# Patient Record
Sex: Female | Born: 1985 | Race: White | Hispanic: No | Marital: Married | Smoking: Never smoker
Health system: Southern US, Community
[De-identification: ages and names within clinical notes are randomized; demographics above are authoritative.]

## PROBLEM LIST (undated history)

## (undated) ENCOUNTER — Inpatient Hospital Stay (HOSPITAL_COMMUNITY): Payer: Self-pay

## (undated) DIAGNOSIS — Z9889 Other specified postprocedural states: Secondary | ICD-10-CM

## (undated) DIAGNOSIS — Z789 Other specified health status: Secondary | ICD-10-CM

## (undated) DIAGNOSIS — Z87440 Personal history of urinary (tract) infections: Secondary | ICD-10-CM

## (undated) DIAGNOSIS — Z87448 Personal history of other diseases of urinary system: Secondary | ICD-10-CM

## (undated) DIAGNOSIS — R112 Nausea with vomiting, unspecified: Secondary | ICD-10-CM

## (undated) HISTORY — DX: Other specified health status: Z78.9

## (undated) HISTORY — PX: BREAST ENHANCEMENT SURGERY: SHX7

## (undated) HISTORY — PX: WISDOM TOOTH EXTRACTION: SHX21

---
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
## 2006-05-28 ENCOUNTER — Other Ambulatory Visit: Admission: RE | Admit: 2006-05-28 | Discharge: 2006-05-28 | Payer: Self-pay | Admitting: Obstetrics and Gynecology

## 2007-07-01 ENCOUNTER — Other Ambulatory Visit: Admission: RE | Admit: 2007-07-01 | Discharge: 2007-07-01 | Payer: Self-pay | Admitting: Obstetrics and Gynecology

## 2008-08-17 ENCOUNTER — Other Ambulatory Visit: Admission: RE | Admit: 2008-08-17 | Discharge: 2008-08-17 | Payer: Self-pay | Admitting: Obstetrics and Gynecology

## 2008-12-15 ENCOUNTER — Emergency Department (HOSPITAL_COMMUNITY): Admission: EM | Admit: 2008-12-15 | Discharge: 2008-12-15 | Payer: Self-pay | Admitting: Emergency Medicine

## 2009-09-10 ENCOUNTER — Emergency Department (HOSPITAL_BASED_OUTPATIENT_CLINIC_OR_DEPARTMENT_OTHER): Admission: EM | Admit: 2009-09-10 | Discharge: 2009-09-10 | Payer: Self-pay | Admitting: Emergency Medicine

## 2009-10-23 ENCOUNTER — Other Ambulatory Visit: Admission: RE | Admit: 2009-10-23 | Discharge: 2009-10-23 | Payer: Self-pay | Admitting: Obstetrics and Gynecology

## 2010-05-11 LAB — COMPREHENSIVE METABOLIC PANEL
ALT: 17 U/L (ref 0–35)
AST: 28 U/L (ref 0–37)
Albumin: 4.2 g/dL (ref 3.5–5.2)
Alkaline Phosphatase: 51 U/L (ref 39–117)
BUN: 19 mg/dL (ref 6–23)
CO2: 20 mEq/L (ref 19–32)
Calcium: 9 mg/dL (ref 8.4–10.5)
Chloride: 108 mEq/L (ref 96–112)
Creatinine, Ser: 0.8 mg/dL (ref 0.4–1.2)
GFR calc Af Amer: >60 mL/min (ref >60)
GFR calc non Af Amer: >60 mL/min (ref >60)
Glucose, Bld: 100 mg/dL — ABNORMAL HIGH (ref 70–99)
Potassium: 3.9 mEq/L (ref 3.5–5.1)
Sodium: 143 mEq/L (ref 135–145)
Total Bilirubin: 1.1 mg/dL (ref 0.3–1.2)
Total Protein: 8.1 g/dL (ref 6.0–8.3)

## 2010-05-11 LAB — URINE MICROSCOPIC-ADD ON

## 2010-05-11 LAB — DIFFERENTIAL
Basophils Absolute: 0 10*3/uL (ref 0.0–0.1)
Basophils Relative: 1 % (ref 0–1)
Eosinophils Absolute: 0.1 10*3/uL (ref 0.0–0.7)
Eosinophils Relative: 1 % (ref 0–5)
Lymphocytes Relative: 5 % — ABNORMAL LOW (ref 12–46)
Lymphs Abs: 0.4 10*3/uL — ABNORMAL LOW (ref 0.7–4.0)
Monocytes Absolute: 0.6 10*3/uL (ref 0.1–1.0)
Monocytes Relative: 9 % (ref 3–12)
Neutro Abs: 5.9 10*3/uL (ref 1.7–7.7)
Neutrophils Relative %: 84 % — ABNORMAL HIGH (ref 43–77)

## 2010-05-11 LAB — PREGNANCY, URINE: Preg Test, Ur: NEGATIVE

## 2010-05-11 LAB — URINALYSIS, ROUTINE W REFLEX MICROSCOPIC
Glucose, UA: NEGATIVE mg/dL
Ketones, ur: 15 mg/dL — AB
Nitrite: NEGATIVE
Protein, ur: NEGATIVE mg/dL
Specific Gravity, Urine: 1.029 (ref 1.005–1.030)
Urobilinogen, UA: 0.2 mg/dL (ref 0.0–1.0)
pH: 6 (ref 5.0–8.0)

## 2010-05-11 LAB — CBC
HCT: 44.7 % (ref 36.0–46.0)
Hemoglobin: 15 g/dL (ref 12.0–15.0)
MCH: 30 pg (ref 26.0–34.0)
MCHC: 33.6 g/dL (ref 30.0–36.0)
MCV: 89.2 fL (ref 78.0–100.0)
Platelets: 165 10*3/uL (ref 150–400)
RBC: 5.01 MIL/uL (ref 3.87–5.11)
RDW: 12.4 % (ref 11.5–15.5)
WBC: 7 10*3/uL (ref 4.0–10.5)

## 2010-05-11 LAB — LIPASE, BLOOD: Lipase: 140 U/L (ref 23–300)

## 2010-05-30 LAB — URINE MICROSCOPIC-ADD ON

## 2010-05-30 LAB — DIFFERENTIAL
Basophils Absolute: 0 10*3/uL (ref 0.0–0.1)
Basophils Relative: 0 % (ref 0–1)
Eosinophils Absolute: 0.2 10*3/uL (ref 0.0–0.7)
Eosinophils Relative: 4 % (ref 0–5)
Lymphocytes Relative: 10 % — ABNORMAL LOW (ref 12–46)
Lymphs Abs: 0.6 10*3/uL — ABNORMAL LOW (ref 0.7–4.0)
Monocytes Absolute: 0.7 10*3/uL (ref 0.1–1.0)
Monocytes Relative: 13 % — ABNORMAL HIGH (ref 3–12)
Neutro Abs: 4.3 10*3/uL (ref 1.7–7.7)
Neutrophils Relative %: 73 % (ref 43–77)

## 2010-05-30 LAB — COMPREHENSIVE METABOLIC PANEL
ALT: 26 U/L (ref 0–35)
AST: 24 U/L (ref 0–37)
Albumin: 3.7 g/dL (ref 3.5–5.2)
Alkaline Phosphatase: 63 U/L (ref 39–117)
BUN: 8 mg/dL (ref 6–23)
CO2: 23 mEq/L (ref 19–32)
Calcium: 9 mg/dL (ref 8.4–10.5)
Chloride: 103 mEq/L (ref 96–112)
Creatinine, Ser: 0.83 mg/dL (ref 0.4–1.2)
GFR calc Af Amer: >60 mL/min (ref >60)
GFR calc non Af Amer: >60 mL/min (ref >60)
Glucose, Bld: 88 mg/dL (ref 70–99)
Potassium: 3.1 mEq/L — ABNORMAL LOW (ref 3.5–5.1)
Sodium: 136 mEq/L (ref 135–145)
Total Bilirubin: 1.1 mg/dL (ref 0.3–1.2)
Total Protein: 7 g/dL (ref 6.0–8.3)

## 2010-05-30 LAB — GIARDIA/CRYPTOSPORIDIUM SCREEN(EIA)
Cryptosporidium Screen (EIA): NEGATIVE
Giardia Screen - EIA: NEGATIVE

## 2010-05-30 LAB — URINALYSIS, ROUTINE W REFLEX MICROSCOPIC
Bilirubin Urine: NEGATIVE
Glucose, UA: NEGATIVE mg/dL
Hgb urine dipstick: NEGATIVE
Ketones, ur: 15 mg/dL — AB
Nitrite: NEGATIVE
Protein, ur: NEGATIVE mg/dL
Specific Gravity, Urine: 1.02 (ref 1.005–1.030)
Urobilinogen, UA: 0.2 mg/dL (ref 0.0–1.0)
pH: 6.5 (ref 5.0–8.0)

## 2010-05-30 LAB — CBC
HCT: 41.4 % (ref 36.0–46.0)
Hemoglobin: 14.6 g/dL (ref 12.0–15.0)
MCHC: 35.3 g/dL (ref 30.0–36.0)
MCV: 89.6 fL (ref 78.0–100.0)
Platelets: 134 10*3/uL — ABNORMAL LOW (ref 150–400)
RBC: 4.62 MIL/uL (ref 3.87–5.11)
RDW: 12.7 % (ref 11.5–15.5)
WBC: 5.8 10*3/uL (ref 4.0–10.5)

## 2010-05-30 LAB — POCT PREGNANCY, URINE: Preg Test, Ur: NEGATIVE

## 2010-05-30 LAB — LIPASE, BLOOD: Lipase: 19 U/L (ref 11–59)

## 2011-03-13 ENCOUNTER — Other Ambulatory Visit (HOSPITAL_COMMUNITY)
Admission: RE | Admit: 2011-03-13 | Discharge: 2011-03-13 | Disposition: A | Discharge status: Home / Self Care | Payer: BC Managed Care – PPO | Source: Ambulatory Visit | Attending: Obstetrics and Gynecology | Admitting: Obstetrics and Gynecology

## 2011-03-13 ENCOUNTER — Other Ambulatory Visit: Payer: Self-pay | Admitting: Nurse Practitioner

## 2011-03-13 DIAGNOSIS — Z01419 Encounter for gynecological examination (general) (routine) without abnormal findings: Secondary | ICD-10-CM | POA: Insufficient documentation

## 2011-03-27 ENCOUNTER — Emergency Department (HOSPITAL_BASED_OUTPATIENT_CLINIC_OR_DEPARTMENT_OTHER)
Admission: EM | Admit: 2011-03-27 | Discharge: 2011-03-27 | Disposition: A | Discharge status: Home / Self Care | Payer: BC Managed Care – PPO | Attending: Emergency Medicine | Admitting: Emergency Medicine

## 2011-03-27 ENCOUNTER — Encounter (HOSPITAL_BASED_OUTPATIENT_CLINIC_OR_DEPARTMENT_OTHER): Payer: Self-pay | Admitting: *Deleted

## 2011-03-27 ENCOUNTER — Emergency Department (INDEPENDENT_AMBULATORY_CARE_PROVIDER_SITE_OTHER): Payer: BC Managed Care – PPO

## 2011-03-27 DIAGNOSIS — R1031 Right lower quadrant pain: Secondary | ICD-10-CM | POA: Insufficient documentation

## 2011-03-27 DIAGNOSIS — K3189 Other diseases of stomach and duodenum: Secondary | ICD-10-CM

## 2011-03-27 DIAGNOSIS — R112 Nausea with vomiting, unspecified: Secondary | ICD-10-CM

## 2011-03-27 DIAGNOSIS — R109 Unspecified abdominal pain: Secondary | ICD-10-CM

## 2011-03-27 LAB — URINALYSIS, ROUTINE W REFLEX MICROSCOPIC
Bilirubin Urine: NEGATIVE
Glucose, UA: NEGATIVE mg/dL
Hgb urine dipstick: NEGATIVE
Ketones, ur: NEGATIVE mg/dL
Nitrite: NEGATIVE
Protein, ur: NEGATIVE mg/dL
Specific Gravity, Urine: 1.012 (ref 1.005–1.030)
Urobilinogen, UA: 0.2 mg/dL (ref 0.0–1.0)
pH: 7.5 (ref 5.0–8.0)

## 2011-03-27 LAB — DIFFERENTIAL
Basophils Relative: 0 % (ref 0–1)
Eosinophils Absolute: 0.1 10*3/uL (ref 0.0–0.7)
Eosinophils Relative: 2 % (ref 0–5)
Lymphs Abs: 1.3 10*3/uL (ref 0.7–4.0)
Monocytes Relative: 4 % (ref 3–12)

## 2011-03-27 LAB — CBC
MCH: 29.5 pg (ref 26.0–34.0)
MCHC: 35 g/dL (ref 30.0–36.0)
MCV: 84.2 fL (ref 78.0–100.0)
Platelets: 176 10*3/uL (ref 150–400)
RBC: 4.75 MIL/uL (ref 3.87–5.11)

## 2011-03-27 LAB — COMPREHENSIVE METABOLIC PANEL
Albumin: 3.7 g/dL (ref 3.5–5.2)
BUN: 16 mg/dL (ref 6–23)
Calcium: 9.5 mg/dL (ref 8.4–10.5)
GFR calc Af Amer: >90 mL/min (ref >90)
Glucose, Bld: 82 mg/dL (ref 70–99)
Sodium: 138 mEq/L (ref 135–145)
Total Protein: 7.1 g/dL (ref 6.0–8.3)

## 2011-03-27 LAB — URINE MICROSCOPIC-ADD ON

## 2011-03-27 LAB — LIPASE, BLOOD: Lipase: 26 U/L (ref 11–59)

## 2011-03-27 LAB — PREGNANCY, URINE: Preg Test, Ur: NEGATIVE

## 2011-03-27 MED ORDER — IOHEXOL 300 MG/ML  SOLN
20.0000 mL | Freq: Once | INTRAMUSCULAR | Status: AC | PRN
  Administered 2011-03-27: 20 mL via ORAL

## 2011-03-27 MED ORDER — OXYCODONE-ACETAMINOPHEN 5-325 MG PO TABS: 1.0000 | ORAL_TABLET | ORAL | Status: AC | PRN

## 2011-03-27 MED ORDER — HYDROMORPHONE HCL PF 1 MG/ML IJ SOLN
1.0000 mg | Freq: Once | INTRAMUSCULAR | Status: AC
  Administered 2011-03-27: 1 mg via INTRAVENOUS
  Filled 2011-03-27: qty 1

## 2011-03-27 MED ORDER — SODIUM CHLORIDE 0.9 % IV SOLN
Freq: Once | INTRAVENOUS | Status: AC
  Administered 2011-03-27: 20:00:00 via INTRAVENOUS

## 2011-03-27 MED ORDER — PROMETHAZINE HCL 25 MG PO TABS: 25.0000 mg | ORAL_TABLET | Freq: Four times a day (QID) | ORAL | Status: DC | PRN

## 2011-03-27 MED ORDER — IOHEXOL 300 MG/ML  SOLN
100.0000 mL | Freq: Once | INTRAMUSCULAR | Status: AC | PRN
  Administered 2011-03-27: 100 mL via INTRAVENOUS

## 2011-03-27 MED ORDER — ONDANSETRON HCL 4 MG/2ML IJ SOLN
4.0000 mg | Freq: Once | INTRAMUSCULAR | Status: AC
  Administered 2011-03-27: 4 mg via INTRAVENOUS
  Filled 2011-03-27: qty 2

## 2011-03-27 NOTE — ED Provider Notes (Signed)
History     CSN: [id number]  Arrival date & time 03/27/11  4098   First MD Initiated Contact with Patient 03/27/11 1912      Chief Complaint  Patient presents with  . Abdominal Pain    (Consider location/radiation/quality/duration/timing/severity/associated sxs/prior treatment) Patient is a 26 y.o. female presenting with abdominal pain. The history is provided by the patient.  Abdominal Pain The primary symptoms of the illness include abdominal pain.   3 days ago, she had pain across her mid abdomen which was severe. She rated it at 10 out of 10, but it resolved spontaneously. This morning, she had onset at about 10:30 AM of the same pain. It is sharp and dull and crampy. Nothing makes it better and nothing makes it worse. There is associated nausea but no vomiting. She denies constipation or diarrhea. She denies urinary difficulty. She denies fever, chills, sweats. She tried taking TUMS which did not give her any relief. She saw her primary care doctor who is worried that it might be her appendix and might be her gallbladder and sent her here to get a CT scan. She is currently on her menses.  History reviewed. No pertinent past medical history.  History reviewed. No pertinent past surgical history.  No family history on file.  History  Substance Use Topics  . Smoking status: Never Smoker   . Smokeless tobacco: Not on file  . Alcohol Use: No    OB History    Grav Para Term Preterm Abortions TAB SAB Ect Mult Living                  Review of Systems  Gastrointestinal: Positive for abdominal pain.  All other systems reviewed and are negative.    Allergies  Latex  Home Medications   Current Outpatient Rx  Name Route Sig Dispense Refill  . ACETAMINOPHEN-CAFF-PYRILAMINE 500-60-15 MG PO TABS Oral Take 2 tablets by mouth once as needed. For pain    . CALCIUM CARBONATE ANTACID 500 MG PO CHEW Oral Chew 2 tablets by mouth once as needed. For stomach pain    . CITALOPRAM  HYDROBROMIDE 20 MG PO TABS Oral Take 20 mg by mouth daily.    . ADULT MULTIVITAMIN W/MINERALS CH Oral Take 1 tablet by mouth daily.    Azzie Roup ACE-ETH ESTRAD-FE 1.5-30 MG-MCG PO TABS Oral Take 1 tablet by mouth daily.    Marland Kitchen VITAMIN C 500 MG PO TABS Oral Take 1,000 mg by mouth daily.      BP 100/69  Pulse 66  Temp(Src) 98 F (36.7 C) (Oral)  Resp 20  SpO2 100%  LMP 03/23/2011  Physical Exam  Nursing note and vitals reviewed.  26 year old female who is resting comfortably and in no acute distress. Vital signs are normal. Head is normocephalic and atraumatic. There is anisocoria present - right pupil is 6 mm and left pupil is 5 mm. Both react to light and accommodation. EOMs are full. Oropharynx is clear. Neck is supple without adenopathy. Back is nontender and there is no CVA tenderness. Lungs are clear without rales, wheezes, rhonchi. Heart has regular rate and rhythm without murmur. Is no chest wall tenderness. Abdomen is soft and flat. There is moderate tenderness across the mid abdomen. There is no tenderness in the subcostal area and there is a negative Murphy sign. There is no suprapubic or right lower quadrant tenderness. There is no rebound or guarding. Peristalsis is diminished but present. There is no hepatosplenomegaly. Extremities have no  no cyanosis or edema, full range of motion present. Skin is warm and moist without rash. Neurologic: Mental status is normal, cranial nerves are intact, there no focal motor or sensory deficits. Psychiatric: No abnormalities of mood or affect.  ED Course  Procedures (including critical care time)   Results for orders placed during the hospital encounter of 03/27/11  PREGNANCY, URINE      Component Value Range   Preg Test, Ur NEGATIVE  NEGATIVE   URINALYSIS, ROUTINE W REFLEX MICROSCOPIC      Component Value Range   Color, Urine YELLOW  YELLOW    APPearance CLEAR  CLEAR    Specific Gravity, Urine 1.012  1.005 - 1.030    pH 7.5  5.0 - 8.0     Glucose, UA NEGATIVE  NEGATIVE (mg/dL)   Hgb urine dipstick NEGATIVE  NEGATIVE    Bilirubin Urine NEGATIVE  NEGATIVE    Ketones, ur NEGATIVE  NEGATIVE (mg/dL)   Protein, ur NEGATIVE  NEGATIVE (mg/dL)   Urobilinogen, UA 0.2  0.0 - 1.0 (mg/dL)   Nitrite NEGATIVE  NEGATIVE    Leukocytes, UA SMALL (*) NEGATIVE   URINE MICROSCOPIC-ADD ON      Component Value Range   Squamous Epithelial / LPF RARE  RARE    WBC, UA 3-6  <3 (WBC/hpf)   Bacteria, UA RARE  RARE   CBC      Component Value Range   WBC 8.1  4.0 - 10.5 (K/uL)   RBC 4.75  3.87 - 5.11 (MIL/uL)   Hemoglobin 14.0  12.0 - 15.0 (g/dL)   HCT 16.1  09.6 - 04.5 (%)   MCV 84.2  78.0 - 100.0 (fL)   MCH 29.5  26.0 - 34.0 (pg)   MCHC 35.0  30.0 - 36.0 (g/dL)   RDW 40.9  81.1 - 91.4 (%)   Platelets 176  150 - 400 (K/uL)  DIFFERENTIAL      Component Value Range   Neutrophils Relative 79 (*) 43 - 77 (%)   Neutro Abs 6.4  1.7 - 7.7 (K/uL)   Lymphocytes Relative 16  12 - 46 (%)   Lymphs Abs 1.3  0.7 - 4.0 (K/uL)   Monocytes Relative 4  3 - 12 (%)   Monocytes Absolute 0.3  0.1 - 1.0 (K/uL)   Eosinophils Relative 2  0 - 5 (%)   Eosinophils Absolute 0.1  0.0 - 0.7 (K/uL)   Basophils Relative 0  0 - 1 (%)   Basophils Absolute 0.0  0.0 - 0.1 (K/uL)  COMPREHENSIVE METABOLIC PANEL      Component Value Range   Sodium 138  135 - 145 (mEq/L)   Potassium 3.8  3.5 - 5.1 (mEq/L)   Chloride 102  96 - 112 (mEq/L)   CO2 26  19 - 32 (mEq/L)   Glucose, Bld 82  70 - 99 (mg/dL)   BUN 16  6 - 23 (mg/dL)   Creatinine, Ser 7.82  0.50 - 1.10 (mg/dL)   Calcium 9.5  8.4 - 95.6 (mg/dL)   Total Protein 7.1  6.0 - 8.3 (g/dL)   Albumin 3.7  3.5 - 5.2 (g/dL)   AST 23  0 - 37 (U/L)   ALT 18  0 - 35 (U/L)   Alkaline Phosphatase 61  39 - 117 (U/L)   Total Bilirubin 0.7  0.3 - 1.2 (mg/dL)   GFR calc non Af Amer >90  >90 (mL/min)   GFR calc Af Amer >90  >90 (mL/min)  LIPASE, BLOOD      Component Value Range   Lipase 26  11 - 59 (U/L)   Ct Abdomen Pelvis W  Contrast  03/27/2011  *RADIOLOGY REPORT*  Clinical Data: Endogastric pain, nausea, vomiting  CT ABDOMEN AND PELVIS WITH CONTRAST  Technique:  Multidetector CT imaging of the abdomen and pelvis was performed following the standard protocol during bolus administration of intravenous contrast. Sagittal and coronal MPR images reconstructed from axial data set.  Contrast: Dilute oral contrast. 100 ml Omnipaque 300 IV.  Comparison: None.  Findings: Lung bases clear. Inhomogeneous enhancement of hepatic parenchyma suspect technical in origin. No definite focal mass lesions of liver, spleen, pancreas, kidneys, or adrenal glands. Nonspecific free pelvic fluid. Normal appendix. Stomach and bowel loops grossly unremarkable. Normal-appearing bladder, uterus, and adnexae. No mass, adenopathy, or hernia. No acute osseous a.  IMPRESSION: Small amount nonspecific free pelvic fluid. Inhomogeneous hepatic attenuation, likely related to incomplete enhancement secondary to timing of imaging versus contrast administration and possibly hydration status. No other definite intra-abdominal or intrapelvic abnormalities seen.  Original Report Authenticated By: Lollie Marrow, M.D.    She was given IV fluids and IV Dilaudid with Zofran with significant relief of symptoms. Have discussed the findings on CT and laboratory workup with her. Further workup would need to be to a gastroenterologist if symptoms persist. She is sent home with a prescription for Percocet and for Phenergan.  1. Abdominal pain       MDM  Abdominal pain which seems very unlikely to be biliary in nature with no subcostal tenderness and negative Murphy sign. Exam is not especially worrisome for appendicitis. I discussed with patient possibility of having lab tests done and deciding whether to get a CT scan based on lab results. However, and she states she was sent to the ED to get a CT scan done. I've advised her of the radiation risks of the CT, but she still  wishes to get the CT scan done.        Dione Booze, MD 03/27/11 2049

## 2011-03-27 NOTE — ED Notes (Signed)
Pt c/o periumbilical pain with periumbilical tenderness. Pt was seen by PMD earlier and sent here for appendicitis and gall bladder r/o.

## 2011-03-27 NOTE — ED Notes (Signed)
Mid abdominal pain x 3 days. Pale. Was seen by her MD earlier today.

## 2012-03-16 ENCOUNTER — Other Ambulatory Visit (HOSPITAL_COMMUNITY)
Admission: RE | Admit: 2012-03-16 | Discharge: 2012-03-16 | Disposition: A | Discharge status: Home / Self Care | Payer: BC Managed Care – PPO | Source: Ambulatory Visit | Attending: Obstetrics and Gynecology | Admitting: Obstetrics and Gynecology

## 2012-03-16 ENCOUNTER — Other Ambulatory Visit: Payer: Self-pay | Admitting: Nurse Practitioner

## 2012-03-16 DIAGNOSIS — N760 Acute vaginitis: Secondary | ICD-10-CM | POA: Insufficient documentation

## 2012-03-16 DIAGNOSIS — Z01419 Encounter for gynecological examination (general) (routine) without abnormal findings: Secondary | ICD-10-CM | POA: Insufficient documentation

## 2012-03-16 DIAGNOSIS — Z113 Encounter for screening for infections with a predominantly sexual mode of transmission: Secondary | ICD-10-CM | POA: Insufficient documentation

## 2014-03-16 ENCOUNTER — Other Ambulatory Visit: Payer: Self-pay | Admitting: Nurse Practitioner

## 2014-03-16 ENCOUNTER — Other Ambulatory Visit (HOSPITAL_COMMUNITY)
Admission: RE | Admit: 2014-03-16 | Discharge: 2014-03-16 | Disposition: A | Discharge status: Home / Self Care | Payer: BC Managed Care – PPO | Source: Ambulatory Visit | Attending: Obstetrics and Gynecology | Admitting: Obstetrics and Gynecology

## 2014-03-16 DIAGNOSIS — Z01419 Encounter for gynecological examination (general) (routine) without abnormal findings: Secondary | ICD-10-CM | POA: Insufficient documentation

## 2014-03-21 LAB — CYTOLOGY - PAP

## 2015-08-03 ENCOUNTER — Other Ambulatory Visit: Payer: Self-pay | Admitting: Obstetrics and Gynecology

## 2015-08-03 DIAGNOSIS — R1013 Epigastric pain: Secondary | ICD-10-CM

## 2015-08-03 DIAGNOSIS — R1084 Generalized abdominal pain: Secondary | ICD-10-CM

## 2015-08-07 ENCOUNTER — Other Ambulatory Visit: Payer: Self-pay | Admitting: Obstetrics and Gynecology

## 2015-08-07 ENCOUNTER — Ambulatory Visit
Admission: RE | Admit: 2015-08-07 | Discharge: 2015-08-07 | Disposition: A | Discharge status: Home / Self Care | Payer: BC Managed Care – PPO | Source: Ambulatory Visit | Attending: Obstetrics and Gynecology | Admitting: Obstetrics and Gynecology

## 2015-08-07 DIAGNOSIS — R1084 Generalized abdominal pain: Secondary | ICD-10-CM

## 2015-08-07 DIAGNOSIS — R1013 Epigastric pain: Secondary | ICD-10-CM

## 2015-08-07 DIAGNOSIS — R52 Pain, unspecified: Secondary | ICD-10-CM

## 2015-08-08 ENCOUNTER — Ambulatory Visit
Admission: RE | Admit: 2015-08-08 | Discharge: 2015-08-08 | Disposition: A | Discharge status: Home / Self Care | Payer: BC Managed Care – PPO | Source: Ambulatory Visit | Attending: Obstetrics and Gynecology | Admitting: Obstetrics and Gynecology

## 2015-08-08 DIAGNOSIS — R1013 Epigastric pain: Secondary | ICD-10-CM

## 2015-08-10 ENCOUNTER — Other Ambulatory Visit: Payer: BC Managed Care – PPO

## 2016-04-03 LAB — OB RESULTS CONSOLE HIV ANTIBODY (ROUTINE TESTING): HIV: NONREACTIVE

## 2016-04-03 LAB — OB RESULTS CONSOLE RUBELLA ANTIBODY, IGM: RUBELLA: IMMUNE

## 2016-04-03 LAB — OB RESULTS CONSOLE GC/CHLAMYDIA
CHLAMYDIA, DNA PROBE: NEGATIVE
GC PROBE AMP, GENITAL: NEGATIVE

## 2016-04-03 LAB — OB RESULTS CONSOLE RPR: RPR: NONREACTIVE

## 2016-04-03 LAB — OB RESULTS CONSOLE ANTIBODY SCREEN: ANTIBODY SCREEN: NEGATIVE

## 2016-04-03 LAB — OB RESULTS CONSOLE ABO/RH: RH TYPE: POSITIVE

## 2016-04-03 LAB — OB RESULTS CONSOLE HEPATITIS B SURFACE ANTIGEN: Hepatitis B Surface Ag: NEGATIVE

## 2016-04-29 ENCOUNTER — Other Ambulatory Visit: Payer: Self-pay | Admitting: Obstetrics and Gynecology

## 2016-04-29 ENCOUNTER — Other Ambulatory Visit (HOSPITAL_COMMUNITY)
Admission: RE | Admit: 2016-04-29 | Discharge: 2016-04-29 | Disposition: A | Discharge status: Home / Self Care | Payer: BC Managed Care – PPO | Source: Ambulatory Visit | Attending: Obstetrics and Gynecology | Admitting: Obstetrics and Gynecology

## 2016-04-29 DIAGNOSIS — Z01419 Encounter for gynecological examination (general) (routine) without abnormal findings: Secondary | ICD-10-CM | POA: Diagnosis present

## 2016-04-29 DIAGNOSIS — Z113 Encounter for screening for infections with a predominantly sexual mode of transmission: Secondary | ICD-10-CM | POA: Diagnosis present

## 2016-05-01 LAB — CYTOLOGY - PAP
Chlamydia: NEGATIVE
Diagnosis: NEGATIVE
Neisseria Gonorrhea: NEGATIVE

## 2016-07-15 ENCOUNTER — Inpatient Hospital Stay (HOSPITAL_COMMUNITY)
Admission: AD | Admit: 2016-07-15 | Discharge: 2016-07-16 | Disposition: A | Discharge status: Home / Self Care | Payer: BC Managed Care – PPO | Source: Ambulatory Visit | Attending: Obstetrics and Gynecology | Admitting: Obstetrics and Gynecology

## 2016-07-15 DIAGNOSIS — Z91040 Latex allergy status: Secondary | ICD-10-CM | POA: Insufficient documentation

## 2016-07-15 DIAGNOSIS — O26892 Other specified pregnancy related conditions, second trimester: Secondary | ICD-10-CM | POA: Insufficient documentation

## 2016-07-15 DIAGNOSIS — O99612 Diseases of the digestive system complicating pregnancy, second trimester: Secondary | ICD-10-CM | POA: Insufficient documentation

## 2016-07-15 DIAGNOSIS — R109 Unspecified abdominal pain: Secondary | ICD-10-CM | POA: Insufficient documentation

## 2016-07-15 DIAGNOSIS — K5900 Constipation, unspecified: Secondary | ICD-10-CM | POA: Insufficient documentation

## 2016-07-15 DIAGNOSIS — M549 Dorsalgia, unspecified: Secondary | ICD-10-CM | POA: Insufficient documentation

## 2016-07-15 DIAGNOSIS — K625 Hemorrhage of anus and rectum: Secondary | ICD-10-CM | POA: Insufficient documentation

## 2016-07-15 DIAGNOSIS — Z3A23 23 weeks gestation of pregnancy: Secondary | ICD-10-CM | POA: Insufficient documentation

## 2016-07-16 ENCOUNTER — Encounter (HOSPITAL_COMMUNITY): Payer: Self-pay

## 2016-07-16 DIAGNOSIS — K5900 Constipation, unspecified: Secondary | ICD-10-CM | POA: Diagnosis not present

## 2016-07-16 DIAGNOSIS — R109 Unspecified abdominal pain: Secondary | ICD-10-CM | POA: Diagnosis present

## 2016-07-16 DIAGNOSIS — O26892 Other specified pregnancy related conditions, second trimester: Secondary | ICD-10-CM | POA: Diagnosis not present

## 2016-07-16 DIAGNOSIS — M549 Dorsalgia, unspecified: Secondary | ICD-10-CM | POA: Diagnosis present

## 2016-07-16 DIAGNOSIS — K625 Hemorrhage of anus and rectum: Secondary | ICD-10-CM | POA: Diagnosis not present

## 2016-07-16 DIAGNOSIS — Z91040 Latex allergy status: Secondary | ICD-10-CM | POA: Diagnosis not present

## 2016-07-16 DIAGNOSIS — O99612 Diseases of the digestive system complicating pregnancy, second trimester: Secondary | ICD-10-CM | POA: Diagnosis not present

## 2016-07-16 DIAGNOSIS — Z3A23 23 weeks gestation of pregnancy: Secondary | ICD-10-CM | POA: Diagnosis not present

## 2016-07-16 LAB — WET PREP, GENITAL
CLUE CELLS WET PREP: NONE SEEN
Sperm: NONE SEEN
Trich, Wet Prep: NONE SEEN
YEAST WET PREP: NONE SEEN

## 2016-07-16 LAB — AMNISURE RUPTURE OF MEMBRANE (ROM) NOT AT ARMC: Amnisure ROM: NEGATIVE

## 2016-07-16 MED ORDER — ACETAMINOPHEN 500 MG PO TABS: 1000.0000 mg | ORAL_TABLET | Freq: Once | ORAL | Status: DC

## 2016-07-16 MED ORDER — MINERAL OIL RE ENEM: 1.0000 | ENEMA | Freq: Once | RECTAL | Status: DC

## 2016-07-16 NOTE — MAU Note (Signed)
Low abd and low back pain since 7 pm. Had a normal bowel movement yesterday. Felt pressure, was instructed to take stool softner at 8:30 pm didn't help. Saw brownish-red but not sure where came from. Feeling baby move today.  Watery when tried to use bathroom so not sure if water broke or not.

## 2016-07-16 NOTE — MAU Provider Note (Signed)
History    Amber Richard is a 30y.o. G1P0 at 23wks who presents for abdominal and back pain related to constipation.  Patient states that she had a bowel movement yesterday that was soft and easy to pass.  Patient states that today she has been unable to have a bowel movement despite the urge.  Patient reports rectal pressure, rectal bleeding, and passage of small amts of stool but no relief.  Patient states she has ate "lots of fiber" today.  Patient denies cramping and vaginal bleeding, but reports LOF and is unsure of location.   There are no active problems to display for this patient.   Chief Complaint  Patient presents with  . Abdominal Pain  . Back Pain   HPI  OB History    Gravida Para Term Preterm AB Living   1             SAB TAB Ectopic Multiple Live Births                  No past medical history on file.  No past surgical history on file.  No family history on file.  Social History  Substance Use Topics  . Smoking status: Never Smoker  . Smokeless tobacco: Not on file  . Alcohol use No    Allergies:  Allergies  Allergen Reactions  . Latex Rash    Prescriptions Prior to Admission  Medication Sig Dispense Refill Last Dose  . Acetaminophen-Caff-Pyrilamine (MIDOL COMPLETE) 500-60-15 MG TABS Take 2 tablets by mouth once as needed. For pain   03/27/2011 at Unknown  . calcium carbonate (TUMS - DOSED IN MG ELEMENTAL CALCIUM) 500 MG chewable tablet Chew 2 tablets by mouth once as needed. For stomach pain     . citalopram (CELEXA) 20 MG tablet Take 20 mg by mouth daily.   03/26/2011 at Unknown  . Multiple Vitamin (MULITIVITAMIN WITH MINERALS) TABS Take 1 tablet by mouth daily.   03/27/2011 at Unknown  . norethindrone-ethinyl estradiol-iron (MICROGESTIN FE,GILDESS FE,LOESTRIN FE) 1.5-30 MG-MCG tablet Take 1 tablet by mouth daily.   03/26/2011 at Unknown  . vitamin C (ASCORBIC ACID) 500 MG tablet Take 1,000 mg by mouth daily.   03/27/2011 at Unknown    Review of  Systems  Constitutional: Negative for chills and fever.  Gastrointestinal: Positive for abdominal pain, blood in stool (Blood with wiping) and constipation. Negative for nausea and vomiting.  Genitourinary: Negative for dysuria and urgency.  Musculoskeletal: Positive for back pain.    See HPI Above Physical Exam   Blood pressure (!) 124/59, pulse 93, temperature 98.1 F (36.7 C), temperature source Oral, resp. rate 20, last menstrual period 08/07/2015, SpO2 98 %.  Results for orders placed or performed during the hospital encounter of 07/15/16 (from the past 24 hour(s))  Wet prep, genital     Status: Abnormal   Collection Time: 07/16/16 12:35 AM  Result Value Ref Range   Yeast Wet Prep HPF POC NONE SEEN NONE SEEN   Trich, Wet Prep NONE SEEN NONE SEEN   Clue Cells Wet Prep HPF POC NONE SEEN NONE SEEN   WBC, Wet Prep HPF POC MANY (A) NONE SEEN   Sperm NONE SEEN   Amnisure rupture of membrane (rom)not at [facility]     Status: None   Collection Time: 07/16/16 12:35 AM  Result Value Ref Range   Amnisure ROM NEGATIVE     Physical Exam  Constitutional: She is oriented to person, place, and time. She appears well-developed  and well-nourished. She appears distressed.  HENT:  Head: Normocephalic and atraumatic.  Eyes: Conjunctivae are normal.  Neck: Normal range of motion.  Cardiovascular: Normal rate, regular rhythm and normal heart sounds.   Respiratory: Effort normal and breath sounds normal.  GI: Soft. Bowel sounds are normal.  Genitourinary: Rectal exam shows no external hemorrhoid. Uterus is enlarged. No bleeding in the vagina. Vaginal discharge found.  Genitourinary Comments: Sterile Speculum Exam: -Vaginal Vault: Copious amt of thin white discharge -wet prep collected -Cervix:No discharge from os, appears closed-GC/CT collected -Bimanual Exam: Closed   Musculoskeletal: Normal range of motion. She exhibits no edema.  Neurological: She is alert and oriented to person, place, and  time.  Skin: Skin is warm and dry.    FHR: 155 bpm, Mod Var, -Decels, +Accels UC: None graphed ED Course  Assessment: IUP at 23wks Cat I FT Abdominal Pain Back Pain Constipation  Plan: -Labs: Wet prep, GC/CT, Amniosure-Collected prior to digital exam -Soap suds enema ordered -Tylenol 1gram for pain -Reassess after enema given  Follow Up (0130) -Patient reports relief s/p enema and passage of large stool -Wet Prep and Amniosure Negative -Instructed to take colace bid until normal regime returns and then daily at bedtime -Keep appt as scheduled: May 30 with Dr. Delrae Sawyers. Cole -Encouraged to call if any questions or concerns arise prior to next scheduled office visit.  -Discharged to home in improved condition  Cherre RobinsJessica L Draya Felker CNM, MSN 07/16/2016 12:36 AM

## 2016-07-16 NOTE — Discharge Instructions (Signed)
Constipation, Adult °Constipation is when a person: °· Poops (has a bowel movement) fewer times in a week than normal. °· Has a hard time pooping. °· Has poop that is dry, hard, or bigger than normal. ° °Follow these instructions at home: °Eating and drinking ° °· Eat foods that have a lot of fiber, such as: °? Fresh fruits and vegetables. °? Whole grains. °? Beans. °· Eat less of foods that are high in fat, low in fiber, or overly processed, such as: °? French fries. °? Hamburgers. °? Cookies. °? Candy. °? Soda. °· Drink enough fluid to keep your pee (urine) clear or pale yellow. °General instructions °· Exercise regularly or as told by your doctor. °· Go to the restroom when you feel like you need to poop. Do not hold it in. °· Take over-the-counter and prescription medicines only as told by your doctor. These include any fiber supplements. °· Do pelvic floor retraining exercises, such as: °? Doing deep breathing while relaxing your lower belly (abdomen). °? Relaxing your pelvic floor while pooping. °· Watch your condition for any changes. °· Keep all follow-up visits as told by your doctor. This is important. °Contact a doctor if: °· You have pain that gets worse. °· You have a fever. °· You have not pooped for 4 days. °· You throw up (vomit). °· You are not hungry. °· You lose weight. °· You are bleeding from the anus. °· You have thin, pencil-like poop (stool). °Get help right away if: °· You have a fever, and your symptoms suddenly get worse. °· You leak poop or have blood in your poop. °· Your belly feels hard or bigger than normal (is bloated). °· You have very bad belly pain. °· You feel dizzy or you faint. °This information is not intended to replace advice given to you by your health care provider. Make sure you discuss any questions you have with your health care provider. °Document Released: 07/30/2007 Document Revised: 08/31/2015 Document Reviewed: 08/01/2015 °Elsevier Interactive Patient Education ©  2017 [employer] ° °

## 2016-07-17 LAB — GC/CHLAMYDIA PROBE AMP (~~LOC~~) NOT AT ARMC
CHLAMYDIA, DNA PROBE: NEGATIVE
NEISSERIA GONORRHEA: NEGATIVE

## 2016-08-19 ENCOUNTER — Encounter: Payer: Self-pay | Admitting: Family Medicine

## 2016-08-19 ENCOUNTER — Ambulatory Visit (INDEPENDENT_AMBULATORY_CARE_PROVIDER_SITE_OTHER): Payer: BC Managed Care – PPO | Admitting: Family Medicine

## 2016-08-19 VITALS — BP 118/72 | Wt 154.6 lb

## 2016-08-19 DIAGNOSIS — Z3A28 28 weeks gestation of pregnancy: Secondary | ICD-10-CM | POA: Diagnosis not present

## 2016-08-19 NOTE — Progress Notes (Signed)
   Subjective:    Patient ID: Amber ShinesKristina Richard, female    DOB: 03-30-1985, 30 y.o.   MRN: [id number]  HPI Patient arrives as a new patient for consult. Patient is currently pregnant-due in September and looking to find a good doctor for her baby girl.  [place] [employer], [employer] ,gyn and [place]  No difficulty with  u s   goo d u s an Engineer, drillingdnnormal    [employer] on the u s  f u us,   f u   Would like to try breat feeding , hoing fof  br pum coverage    Plans n being out for two mo ths  Review of Systems No headache, no major weight loss or weight gain, no chest pain no back pain abdominal pain no change in bowel habits complete ROS otherwise negative     Objective:   Physical Exam   Patient not examined      Assessment & Plan:  Impression patient arrives office primarily for prenatal visit/very long discussion held/at least 30 minutes spent discussing concerns and questions regarding perinatal and early infancy management/questions answered Greater than 50% of this 25 minute face to face visit was spent in counseling and discussion and coordination of care regarding the above diagnosis/diagnosies

## 2016-09-27 ENCOUNTER — Encounter (HOSPITAL_COMMUNITY): Payer: Self-pay | Admitting: *Deleted

## 2016-09-27 ENCOUNTER — Inpatient Hospital Stay (HOSPITAL_COMMUNITY)
Admission: AD | Admit: 2016-09-27 | Discharge: 2016-09-27 | Disposition: A | Discharge status: Home / Self Care | Payer: BC Managed Care – PPO | Source: Ambulatory Visit | Attending: Obstetrics & Gynecology | Admitting: Obstetrics & Gynecology

## 2016-09-27 DIAGNOSIS — O4693 Antepartum hemorrhage, unspecified, third trimester: Secondary | ICD-10-CM

## 2016-09-27 DIAGNOSIS — N859 Noninflammatory disorder of uterus, unspecified: Secondary | ICD-10-CM

## 2016-09-27 DIAGNOSIS — Z3A33 33 weeks gestation of pregnancy: Secondary | ICD-10-CM | POA: Diagnosis not present

## 2016-09-27 DIAGNOSIS — N858 Other specified noninflammatory disorders of uterus: Secondary | ICD-10-CM

## 2016-09-27 DIAGNOSIS — Z91040 Latex allergy status: Secondary | ICD-10-CM | POA: Diagnosis not present

## 2016-09-27 LAB — URINALYSIS, ROUTINE W REFLEX MICROSCOPIC
Bilirubin Urine: NEGATIVE
Glucose, UA: NEGATIVE mg/dL
KETONES UR: NEGATIVE mg/dL
Nitrite: NEGATIVE
PH: 7 (ref 5.0–8.0)
Protein, ur: NEGATIVE mg/dL
SPECIFIC GRAVITY, URINE: 1.002 — AB (ref 1.005–1.030)

## 2016-09-27 MED ORDER — LACTATED RINGERS IV SOLN
INTRAVENOUS | Status: DC
  Administered 2016-09-27: 14:00:00 via INTRAVENOUS

## 2016-09-27 NOTE — MAU Provider Note (Signed)
Chief Complaint:  Vaginal Bleeding and Back Pain   First Provider Initiated Contact with Patient 09/27/16 1313     HPI: Amber Richard is a 31 y.o. G1P0 at 7433w3dwho presents to maternity admissions reporting back pain and vaginal bleeding since last night.  Last IC 3 days ago.  States back pain is severe at times.  . She reports good fetal movement, denies LOF, vaginal itching/burning, urinary symptoms, h/a, dizziness, n/v, diarrhea, constipation or fever/chills.    Vaginal Bleeding  The patient's primary symptoms include vaginal bleeding. The patient's pertinent negatives include no genital itching, genital lesions, genital odor or pelvic pain. This is a new problem. The current episode started yesterday. The problem occurs intermittently. The pain is moderate. The problem affects both sides. She is pregnant. Associated symptoms include back pain. Pertinent negatives include no constipation, diarrhea, dysuria, fever, headaches, nausea or vomiting. The vaginal discharge was bloody. The vaginal bleeding is lighter than menses. She has been passing clots. She has not been passing tissue. Nothing aggravates the symptoms. She has tried nothing for the symptoms.  Back Pain  This is a new problem. The current episode started yesterday. The problem occurs intermittently. The problem is unchanged. The pain is present in the lumbar spine. The pain does not radiate. The pain is severe. The pain is the same all the time. Stiffness is present all day. Pertinent negatives include no dysuria, fever, headaches, numbness, paresis, paresthesias, pelvic pain or tingling. She has tried nothing for the symptoms.   RN Note: Patient presents with vaginal bleeding last night, dark red on pad today, called Dr. Charlotta Newtonzan told to come in, having back pain.   Past Medical History: History reviewed. No pertinent past medical history.  Past obstetric history: OB History  Gravida Para Term Preterm AB Living  1            SAB  TAB Ectopic Multiple Live Births               # Outcome Date GA Lbr Len/2nd Weight Sex Delivery Anes PTL Lv  1 Current               Past Surgical History: Past Surgical History:  Procedure Laterality Date  . WISDOM TOOTH EXTRACTION      Family History: History reviewed. No pertinent family history.  Social History: Social History  Substance Use Topics  . Smoking status: Never Smoker  . Smokeless tobacco: Never Used  . Alcohol use No    Allergies:  Allergies  Allergen Reactions  . Latex Rash    Meds:  Prescriptions Prior to Admission  Medication Sig Dispense Refill Last Dose  . calcium carbonate (TUMS - DOSED IN MG ELEMENTAL CALCIUM) 500 MG chewable tablet Chew 2 tablets by mouth once as needed. For stomach pain     . Multiple Vitamin (MULITIVITAMIN WITH MINERALS) TABS Take 1 tablet by mouth daily.   03/27/2011 at Unknown  . vitamin C (ASCORBIC ACID) 500 MG tablet Take 1,000 mg by mouth daily.   03/27/2011 at Unknown    I have reviewed patient's Past Medical Hx, Surgical Hx, Family Hx, Social Hx, medications and allergies.   ROS:  Review of Systems  Constitutional: Negative for fever.  Gastrointestinal: Negative for constipation, diarrhea, nausea and vomiting.  Genitourinary: Positive for vaginal bleeding. Negative for dysuria and pelvic pain.  Musculoskeletal: Positive for back pain.  Neurological: Negative for tingling, numbness, headaches and paresthesias.   Other systems negative  Physical Exam  Patient Vitals  for the past 24 hrs:  BP Temp Temp src Pulse Resp Height Weight  09/27/16 1251 105/63 97.9 F (36.6 C) Oral 75 16 5\' 5"  (1.651 m) 159 lb (72.1 kg)   Constitutional: Well-developed, well-nourished female in no acute distress.  Cardiovascular: normal rate and rhythm Respiratory: normal effort, clear to auscultation bilaterally GI: Abd soft, non-tender, gravid appropriate for gestational age.   No rebound or guarding. MS: Extremities nontender, no  edema, normal ROM Neurologic: Alert and oriented x 4.  GU: Neg CVAT.  PELVIC EXAM: Cervix pink, visually closed, without lesion, small amount brown discharge, vaginal walls and external genitalia normal Dilation: Closed Effacement (%): 40 Station: Ballotable Exam by:: Artelia LarocheM. Williams CNM    FHT:  Baseline 140 , moderate variability, accelerations present, no decelerations Contractions: q 2 mins, short, only 20 seconds each   Labs:    Results for orders placed or performed during the hospital encounter of 09/27/16 (from the past 24 hour(s))  Urinalysis, Routine w reflex microscopic     Status: Abnormal   Collection Time: 09/27/16 12:50 PM  Result Value Ref Range   Color, Urine STRAW (A) YELLOW   APPearance CLEAR CLEAR   Specific Gravity, Urine 1.002 (L) 1.005 - 1.030   pH 7.0 5.0 - 8.0   Glucose, UA NEGATIVE NEGATIVE mg/dL   Hgb urine dipstick MODERATE (A) NEGATIVE   Bilirubin Urine NEGATIVE NEGATIVE   Ketones, ur NEGATIVE NEGATIVE mg/dL   Protein, ur NEGATIVE NEGATIVE mg/dL   Nitrite NEGATIVE NEGATIVE   Leukocytes, UA TRACE (A) NEGATIVE   RBC / HPF 0-5 0 - 5 RBC/hpf   WBC, UA 0-5 0 - 5 WBC/hpf   Bacteria, UA RARE (A) NONE SEEN   Squamous Epithelial / LPF 6-30 (A) NONE SEEN     Imaging:  No results found.  MAU Course/MDM: I have ordered labs and reviewed results.  NST reviewed and found to be reactive with uterine irritability. Consult Dr Charlotta Newtonzan with presentation, exam findings and test results.  Treatments in MAU included IV hydration and observation. Will recheck in 2 hours.    Cervix unchanged, soft/closed/difficult exam due to pt discomfort/long  Toco continues to show irritability with 20-30 second contractions, not felt by pt (states felt them as cramps last night).  Small amount brown discharge on glove  Updated Dr Charlotta Newtonzan.  She recommends d/c home, rest, recheck this week in office I gave her very strict return precautions, any pain with UCs, any return of red  discharge, come back  Assessment: SIUP at 8068w3d Brown bleeding in third trimester Uterine irritability   Plan: Discharge home Preterm Labor precautions and fetal kick counts Follow up in Office for prenatal visits and recheck of cervix  Encouraged to return here or to other Urgent Care/ED if she develops worsening of symptoms, increase in pain, fever, or other concerning symptoms.   Pt stable at time of discharge.  Wynelle BourgeoisMarie Williams CNM, MSN Certified Nurse-Midwife 09/27/2016 1:13 PM

## 2016-09-27 NOTE — MAU Note (Signed)
Patient presents with vaginal bleeding last night, dark red on pad today, called Dr. Charlotta Newtonzan told to come in, having back pain.

## 2016-09-27 NOTE — Discharge Instructions (Signed)
Pelvic Rest °Pelvic rest may be recommended if: °· Your placenta is partially or completely covering the opening of your cervix (placenta previa). °· There is bleeding between the wall of the uterus and the amniotic sac in the first trimester of pregnancy (subchorionic hemorrhage). °· You went into labor too early (preterm labor). ° °Based on your overall health and the health of your baby, your health care provider will decide if pelvic rest is right for you. °How do I rest my pelvis? °For as long as told by your health care provider: °· Do not have sex, sexual stimulation, or an orgasm. °· Do not use tampons. Do not douche. Do not put anything in your vagina. °· Do not lift anything that is heavier than 10 lb (4.5 kg). °· Avoid activities that take a lot of effort (are strenuous). °· Avoid any activity in which your pelvic muscles could become strained. ° °When should I seek medical care? °Seek medical care if you have: °· Cramping pain in your lower abdomen. °· Vaginal discharge. °· A low, dull backache. °· Regular contractions. °· Uterine tightening. ° °When should I seek immediate medical care? °Seek immediate medical care if: °· You have vaginal bleeding and you are pregnant. ° °This information is not intended to replace advice given to you by your health care provider. Make sure you discuss any questions you have with your health care provider. °Document Released: 06/07/2010 Document Revised: 07/19/2015 Document Reviewed: 08/14/2014 °Elsevier Interactive Patient Education © 2018 [employer] ° °Vaginal Bleeding During Pregnancy, Third Trimester °A small amount of bleeding (spotting) from the vagina is relatively common in pregnancy. Various things can cause bleeding or spotting in pregnancy. Sometimes the bleeding is normal and is not a problem. However, bleeding during the third trimester can also be a sign of something serious for the mother and the baby. Be sure to tell your health care provider about  any vaginal bleeding right away. °Some possible causes of vaginal bleeding during the third trimester include: °· The placenta may be partially or completely covering the opening to the cervix (placenta previa). °· The placenta may have separated from the uterus (abruption of the placenta). °· There may be an infection or growth on the cervix. °· You may be starting labor, called discharging of the mucus plug. °· The placenta may grow into the muscle layer of the uterus (placenta accreta). ° °Follow these instructions at home: °Watch your condition for any changes. The following actions may help to lessen any discomfort you are feeling: °· Follow your health care provider's instructions for limiting your activity. If your health care provider orders bed rest, you may need to stay in bed and only get up to use the bathroom. However, your health care provider may allow you to continue light activity. °· If needed, make plans for someone to help with your regular activities and responsibilities while you are on bed rest. °· Keep track of the number of pads you use each day, how often you change pads, and how soaked (saturated) they are. Write this down. °· Do not use tampons. Do not douche. °· Do not have sexual intercourse or orgasms until approved by your health care provider. °· Follow your health care provider's advice about lifting, driving, and physical activities. °· If you pass any tissue from your vagina, save the tissue so you can show it to your health care provider. °· Only take over-the-counter or prescription medicines as directed by your health care provider. °·   Do not take aspirin because it can make you bleed. °· Keep all follow-up appointments as directed by your health care provider. ° °Contact a health care provider if: °· You have any vaginal bleeding during any part of your pregnancy. °· You have cramps or labor pains. °· You have a fever, not controlled by medicine. °Get help right away  if: °· You have severe cramps or pain in your back or belly (abdomen). °· You have chills. °· You have a gush of fluid from the vagina. °· You pass large clots or tissue from your vagina. °· Your bleeding increases. °· You feel light-headed or weak. °· You pass out. °· You feel less movement or no movement of the baby. °This information is not intended to replace advice given to you by your health care provider. Make sure you discuss any questions you have with your health care provider. °Document Released: 05/03/2002 Document Revised: 07/19/2015 Document Reviewed: 10/18/2012 °Elsevier Interactive Patient Education © 2018 Elsevier Inc. ° °Preterm Labor and Birth Information °The normal length of a pregnancy is 39-41 weeks. Preterm labor is when labor starts before 37 completed weeks of pregnancy. °What are the risk factors for preterm labor? °Preterm labor is more likely to occur in women who: °· Have certain infections during pregnancy such as a bladder infection, sexually transmitted infection, or infection inside the uterus (chorioamnionitis). °· Have a shorter-than-normal cervix. °· Have gone into preterm labor before. °· Have had surgery on their cervix. °· Are younger than age 17 or older than age 35. °· Are African American. °· Are pregnant with twins or multiple babies (multiple gestation). °· Take street drugs or smoke while pregnant. °· Do not gain enough weight while pregnant. °· Became pregnant shortly after having been pregnant. ° °What are the symptoms of preterm labor? °Symptoms of preterm labor include: °· Cramps similar to those that can happen during a menstrual period. The cramps may happen with diarrhea. °· Pain in the abdomen or lower back. °· Regular uterine contractions that may feel like tightening of the abdomen. °· A feeling of increased pressure in the pelvis. °· Increased watery or bloody mucus discharge from the vagina. °· Water breaking (ruptured amniotic sac). ° °Why is it important to  recognize signs of preterm labor? °It is important to recognize signs of preterm labor because babies who are born prematurely may not be fully developed. This can put them at an increased risk for: °· Long-term (chronic) heart and lung problems. °· Difficulty immediately after birth with regulating body systems, including blood sugar, body temperature, heart rate, and breathing rate. °· Bleeding in the brain. °· Cerebral palsy. °· Learning difficulties. °· Death. ° °These risks are highest for babies who are born before 34 weeks of pregnancy. °How is preterm labor treated? °Treatment depends on the length of your pregnancy, your condition, and the health of your baby. It may involve: °· Having a stitch (suture) placed in your cervix to prevent your cervix from opening too early (cerclage). °· Taking or being given medicines, such as: °? Hormone medicines. These may be given early in pregnancy to help support the pregnancy. °? Medicine to stop contractions. °? Medicines to help mature the baby’s lungs. These may be prescribed if the risk of delivery is high. °? Medicines to prevent your baby from developing cerebral palsy. ° °If the labor happens before 34 weeks of pregnancy, you may need to stay in the hospital. °What should I do if I think I   am in preterm labor? °If you think that you are going into preterm labor, call your health care provider right away. °How can I prevent preterm labor in future pregnancies? °To increase your chance of having a full-term pregnancy: °· Do not use any tobacco products, such as cigarettes, chewing tobacco, and e-cigarettes. If you need help quitting, ask your health care provider. °· Do not use street drugs or medicines that have not been prescribed to you during your pregnancy. °· Talk with your health care provider before taking any herbal supplements, even if you have been taking them regularly. °· Make sure you gain a healthy amount of weight during your pregnancy. °· Watch  for infection. If you think that you might have an infection, get it checked right away. °· Make sure to tell your health care provider if you have gone into preterm labor before. ° °This information is not intended to replace advice given to you by your health care provider. Make sure you discuss any questions you have with your health care provider. °Document Released: 05/03/2003 Document Revised: 07/24/2015 Document Reviewed: 07/04/2015 °Elsevier Interactive Patient Education © 2018 Elsevier Inc. ° °

## 2016-10-15 LAB — OB RESULTS CONSOLE GBS: STREP GROUP B AG: POSITIVE

## 2016-11-07 ENCOUNTER — Encounter: Payer: Self-pay | Admitting: Nurse Practitioner

## 2016-11-07 ENCOUNTER — Ambulatory Visit: Payer: BC Managed Care – PPO | Admitting: Family Medicine

## 2016-11-07 ENCOUNTER — Ambulatory Visit (INDEPENDENT_AMBULATORY_CARE_PROVIDER_SITE_OTHER): Payer: BC Managed Care – PPO | Admitting: Nurse Practitioner

## 2016-11-07 VITALS — BP 120/76 | Temp 97.6°F | Wt 166.6 lb

## 2016-11-07 DIAGNOSIS — J069 Acute upper respiratory infection, unspecified: Secondary | ICD-10-CM

## 2016-11-07 DIAGNOSIS — J029 Acute pharyngitis, unspecified: Secondary | ICD-10-CM | POA: Diagnosis not present

## 2016-11-07 MED ORDER — AMOXICILLIN 400 MG/5ML PO SUSR: ORAL | 0 refills | Status: DC

## 2016-11-07 NOTE — Progress Notes (Signed)
Subjective:  Presents for complaints of a very sore throat for the past 3 days. States it feels like "knives" when swallowing. No documented fever. No headache or ear pain. Head congestion with runny nose. Occasional cough with slightly colored mucus. No wheezing. No vomiting diarrhea or abdominal pain. Taking fluids well. Voiding normal limit. Patient is [redacted] weeks pregnant. Works as a Runner, broadcasting/film/video.  Objective:   BP 120/76   Temp 97.6 F (36.4 C) (Oral)   Wt 166 lb 9.6 oz (75.6 kg)   LMP 08/07/2015   BMI 27.72 kg/m  NAD. Alert, oriented. TMs clear effusion, no erythema. Pharynx minimally injected with PND noted. Neck supple with mild soft anterior adenopathy. Lungs clear. Heart regular rate rhythm.  Assessment:  Acute pharyngitis, unspecified etiology  Acute upper respiratory infection    Plan:   Meds ordered this encounter  Medications  . amoxicillin (AMOXIL) 400 MG/5ML suspension    Sig: 2 tsp po BID x 10 d    Dispense:  200 mL    Refill:  0    Order Specific Question:   Supervising Provider    Answer:   Merlyn Albert [[id number]]   Because of her symptoms and upcoming due date, start antibiotics as directed. Symptomatic care per obstetrician. Warning signs reviewed. Call back in 72 hours if no improvement, sooner if worse.

## 2016-11-11 ENCOUNTER — Telehealth: Payer: Self-pay | Admitting: Family Medicine

## 2016-11-11 NOTE — Telephone Encounter (Signed)
Left message to return call 

## 2016-11-11 NOTE — Telephone Encounter (Signed)
Patient seen Amber Richard on 11/07/16 for pharyngitis.  She said her sore throat is getting better, congestion getting worse.  Wants to know if she can take Claritan?  Also, have blood when she blows her nose.

## 2016-11-11 NOTE — Telephone Encounter (Signed)
Yes can take claritin,  Bloody disch not unusual with this type of presentation, if pt feels she is no better, she can take different antibiotic

## 2016-11-11 NOTE — Telephone Encounter (Signed)
FYI patient is [redacted] weeks pregnant.

## 2016-11-11 NOTE — Telephone Encounter (Signed)
Patient called to check on this message.  She is hoping for a phone call ASAP.

## 2016-11-11 NOTE — Telephone Encounter (Signed)
Discussed with pt. Pt verbalized understanding. She wants to just try the claritin for now and will call back if not better

## 2016-11-12 ENCOUNTER — Encounter (HOSPITAL_COMMUNITY): Payer: Self-pay | Admitting: *Deleted

## 2016-11-12 ENCOUNTER — Telehealth (HOSPITAL_COMMUNITY): Payer: Self-pay | Admitting: *Deleted

## 2016-11-12 NOTE — Telephone Encounter (Signed)
Preadmission screen  

## 2016-11-14 ENCOUNTER — Encounter (HOSPITAL_COMMUNITY): Payer: Self-pay | Admitting: *Deleted

## 2016-11-14 ENCOUNTER — Inpatient Hospital Stay (HOSPITAL_COMMUNITY)
Admission: AD | Admit: 2016-11-14 | Discharge: 2016-11-14 | Disposition: A | Discharge status: Home / Self Care | Payer: BC Managed Care – PPO | Source: Ambulatory Visit | Attending: Obstetrics & Gynecology | Admitting: Obstetrics & Gynecology

## 2016-11-14 DIAGNOSIS — Z3A40 40 weeks gestation of pregnancy: Secondary | ICD-10-CM

## 2016-11-14 MED ORDER — ZOLPIDEM TARTRATE 5 MG PO TABS
5.0000 mg | ORAL_TABLET | Freq: Once | ORAL | Status: AC
  Administered 2016-11-14: 5 mg via ORAL
  Filled 2016-11-14: qty 1

## 2016-11-14 MED ORDER — NALBUPHINE HCL 10 MG/ML IJ SOLN
10.0000 mg | Freq: Once | INTRAMUSCULAR | Status: AC
Start: 2016-11-14 — End: 2016-11-14
  Administered 2016-11-14: 10 mg via INTRAMUSCULAR
  Filled 2016-11-14: qty 1

## 2016-11-14 NOTE — MAU Note (Signed)
I have communicated with [facility] and reviewed vital signs:  Vitals:   11/14/16 1830  BP: 108/72  Pulse: 89  Resp: 18  Temp: 97.7 F (36.5 C)  SpO2: 98%    Vaginal exam:  Dilation: 1 Effacement (%): 90 Station: -2 Presentation: Vertex Exam by:: K.Gil Ingwersen,RN,   Also reviewed contraction pattern and that non-stress test is reactive.  It has been documented that patient is contracting every 2-3 minutes with no cervical change since appointment on Tuesday not indicating active labor.  Patient denies any other complaints.  Based on this report provider has given order for discharge.  A discharge order and diagnosis entered by a provider.   Labor discharge instructions reviewed with patient.

## 2016-11-14 NOTE — Discharge Instructions (Signed)

## 2016-11-14 NOTE — MAU Note (Signed)
Contractions "5-1-1" Rating pain 9/10  Denies LOF or VB. States lost mucous plug last night  +FM  VE on Tuesday 1cm

## 2016-11-15 ENCOUNTER — Encounter (HOSPITAL_COMMUNITY): Payer: Self-pay | Admitting: *Deleted

## 2016-11-15 ENCOUNTER — Inpatient Hospital Stay (HOSPITAL_COMMUNITY): Payer: BC Managed Care – PPO | Admitting: Anesthesiology

## 2016-11-15 ENCOUNTER — Inpatient Hospital Stay (HOSPITAL_COMMUNITY)
Admission: AD | Admit: 2016-11-15 | Discharge: 2016-11-19 | DRG: 775 | Disposition: A | Discharge status: Home / Self Care | Payer: BC Managed Care – PPO | Source: Ambulatory Visit | Attending: Obstetrics and Gynecology | Admitting: Obstetrics and Gynecology

## 2016-11-15 DIAGNOSIS — Z91040 Latex allergy status: Secondary | ICD-10-CM

## 2016-11-15 DIAGNOSIS — Z3A40 40 weeks gestation of pregnancy: Secondary | ICD-10-CM

## 2016-11-15 DIAGNOSIS — Z98891 History of uterine scar from previous surgery: Secondary | ICD-10-CM

## 2016-11-15 DIAGNOSIS — Z3493 Encounter for supervision of normal pregnancy, unspecified, third trimester: Secondary | ICD-10-CM | POA: Diagnosis present

## 2016-11-15 DIAGNOSIS — O99824 Streptococcus B carrier state complicating childbirth: Principal | ICD-10-CM | POA: Diagnosis present

## 2016-11-15 LAB — CBC
HCT: 38.5 % (ref 36.0–46.0)
Hemoglobin: 13.6 g/dL (ref 12.0–15.0)
MCH: 30.6 pg (ref 26.0–34.0)
MCHC: 35.3 g/dL (ref 30.0–36.0)
MCV: 86.7 fL (ref 78.0–100.0)
PLATELETS: 163 10*3/uL (ref 150–400)
RBC: 4.44 MIL/uL (ref 3.87–5.11)
RDW: 13.6 % (ref 11.5–15.5)
WBC: 13.2 10*3/uL — ABNORMAL HIGH (ref 4.0–10.5)

## 2016-11-15 LAB — TYPE AND SCREEN
ABO/RH(D): O POS
Antibody Screen: NEGATIVE

## 2016-11-15 LAB — ABO/RH: ABO/RH(D): O POS

## 2016-11-15 MED ORDER — FENTANYL CITRATE (PF) 100 MCG/2ML IJ SOLN
INTRAMUSCULAR | Status: AC
  Filled 2016-11-15: qty 2

## 2016-11-15 MED ORDER — SOD CITRATE-CITRIC ACID 500-334 MG/5ML PO SOLN
30.0000 mL | ORAL | Status: DC | PRN
Start: 2016-11-15 — End: 2016-11-16
  Administered 2016-11-16: 30 mL via ORAL
  Filled 2016-11-15: qty 15

## 2016-11-15 MED ORDER — ACETAMINOPHEN 325 MG PO TABS: 650.0000 mg | ORAL_TABLET | ORAL | Status: DC | PRN

## 2016-11-15 MED ORDER — OXYCODONE-ACETAMINOPHEN 5-325 MG PO TABS: 1.0000 | ORAL_TABLET | ORAL | Status: DC | PRN

## 2016-11-15 MED ORDER — LACTATED RINGERS IV SOLN: INTRAVENOUS | Status: DC

## 2016-11-15 MED ORDER — OXYTOCIN 40 UNITS IN LACTATED RINGERS INFUSION - SIMPLE MED: 2.5000 [IU]/h | INTRAVENOUS | Status: DC

## 2016-11-15 MED ORDER — DIPHENHYDRAMINE HCL 50 MG/ML IJ SOLN: 12.5000 mg | INTRAMUSCULAR | Status: DC | PRN

## 2016-11-15 MED ORDER — EPHEDRINE 5 MG/ML INJ: 10.0000 mg | INTRAVENOUS | Status: DC | PRN

## 2016-11-15 MED ORDER — ONDANSETRON HCL 4 MG/2ML IJ SOLN
4.0000 mg | Freq: Four times a day (QID) | INTRAMUSCULAR | Status: DC | PRN
  Filled 2016-11-15: qty 2

## 2016-11-15 MED ORDER — OXYTOCIN 40 UNITS IN LACTATED RINGERS INFUSION - SIMPLE MED
1.0000 m[IU]/min | INTRAVENOUS | Status: DC
  Administered 2016-11-15: 1 m[IU]/min via INTRAVENOUS

## 2016-11-15 MED ORDER — AMPICILLIN SODIUM 2 G IJ SOLR
2.0000 g | Freq: Once | INTRAMUSCULAR | Status: AC
  Administered 2016-11-15: 2 g via INTRAVENOUS
  Filled 2016-11-15: qty 2000

## 2016-11-15 MED ORDER — LIDOCAINE HCL (PF) 1 % IJ SOLN
INTRAMUSCULAR | Status: AC
  Filled 2016-11-15: qty 30

## 2016-11-15 MED ORDER — PHENYLEPHRINE 40 MCG/ML (10ML) SYRINGE FOR IV PUSH (FOR BLOOD PRESSURE SUPPORT)
80.0000 ug | PREFILLED_SYRINGE | INTRAVENOUS | Status: DC | PRN
  Filled 2016-11-15: qty 10

## 2016-11-15 MED ORDER — LIDOCAINE HCL (PF) 1 % IJ SOLN
INTRAMUSCULAR | Status: DC | PRN
  Administered 2016-11-15 (×2): 4 mL via EPIDURAL

## 2016-11-15 MED ORDER — FENTANYL 2.5 MCG/ML BUPIVACAINE 1/10 % EPIDURAL INFUSION (WH - ANES)
14.0000 mL/h | INTRAMUSCULAR | Status: DC | PRN
  Administered 2016-11-15 (×2): 14 mL/h via EPIDURAL
  Filled 2016-11-15 (×2): qty 100

## 2016-11-15 MED ORDER — OXYTOCIN BOLUS FROM INFUSION
500.0000 mL | Freq: Once | INTRAVENOUS | Status: DC
Start: 2016-11-15 — End: 2016-11-16

## 2016-11-15 MED ORDER — PROMETHAZINE HCL 25 MG/ML IJ SOLN
12.5000 mg | Freq: Four times a day (QID) | INTRAMUSCULAR | Status: DC | PRN
  Filled 2016-11-15: qty 1

## 2016-11-15 MED ORDER — LIDOCAINE HCL (PF) 1 % IJ SOLN: 30.0000 mL | INTRAMUSCULAR | Status: DC | PRN

## 2016-11-15 MED ORDER — OXYTOCIN 40 UNITS IN LACTATED RINGERS INFUSION - SIMPLE MED
INTRAVENOUS | Status: AC
  Filled 2016-11-15: qty 1000

## 2016-11-15 MED ORDER — PHENYLEPHRINE 40 MCG/ML (10ML) SYRINGE FOR IV PUSH (FOR BLOOD PRESSURE SUPPORT): 80.0000 ug | PREFILLED_SYRINGE | INTRAVENOUS | Status: DC | PRN

## 2016-11-15 MED ORDER — TERBUTALINE SULFATE 1 MG/ML IJ SOLN: 0.2500 mg | Freq: Once | INTRAMUSCULAR | Status: DC | PRN

## 2016-11-15 MED ORDER — LACTATED RINGERS IV SOLN: 500.0000 mL | Freq: Once | INTRAVENOUS | Status: DC

## 2016-11-15 MED ORDER — FENTANYL CITRATE (PF) 100 MCG/2ML IJ SOLN
50.0000 ug | Freq: Once | INTRAMUSCULAR | Status: AC
  Administered 2016-11-15: 50 ug via INTRAVENOUS

## 2016-11-15 MED ORDER — LACTATED RINGERS IV SOLN: 500.0000 mL | INTRAVENOUS | Status: DC | PRN

## 2016-11-15 MED ORDER — FLEET ENEMA 7-19 GM/118ML RE ENEM: 1.0000 | ENEMA | RECTAL | Status: DC | PRN

## 2016-11-15 MED ORDER — OXYCODONE-ACETAMINOPHEN 5-325 MG PO TABS: 2.0000 | ORAL_TABLET | ORAL | Status: DC | PRN

## 2016-11-15 NOTE — Anesthesia Pain Management Evaluation Note (Signed)
  CRNA Pain Management Visit Note  Patient: Amber Richard, 31 y.o., female  "Hello I am a member of the anesthesia team at [facility]. We have an anesthesia team available at all times to provide care throughout the hospital, including epidural management and anesthesia for C-section. I don't know your plan for the delivery whether it a natural birth, water birth, IV sedation, nitrous supplementation, doula or epidural, but we want to meet your pain goals."   1.Was your pain managed to your expectations on prior hospitalizations?   No prior hospitalizations  2.What is your expectation for pain management during this hospitalization?     Epidural  3.How can we help you reach that goal? Epidural in p-lace and working well.  Record the patient's initial score and the patient's pain goal.   Pain: 0  Pain Goal: 4 The [facility] wants you to be able to say your pain was always managed very well.  Amber Richard 11/15/2016

## 2016-11-15 NOTE — Anesthesia Procedure Notes (Signed)
Epidural Patient location during procedure: OB Start time: 11/15/2016 5:55 PM  Staffing Anesthesiologist: Mal Amabile Performed: anesthesiologist   Preanesthetic Checklist Completed: patient identified, site marked, surgical consent, pre-op evaluation, timeout performed, IV checked, risks and benefits discussed and monitors and equipment checked  Epidural Patient position: sitting Prep: site prepped and draped and DuraPrep Patient monitoring: continuous pulse ox and blood pressure Approach: midline Location: L3-L4 Injection technique: LOR air  Needle:  Needle type: Tuohy  Needle gauge: 17 G Needle length: 9 cm and 9 Needle insertion depth: 4 cm Catheter type: closed end flexible Catheter size: 19 Gauge Catheter at skin depth: 9 cm Test dose: negative and Other  Assessment Events: blood not aspirated, injection not painful, no injection resistance, negative IV test and no paresthesia  Additional Notes Patient identified. Risks and benefits discussed including failed block, incomplete  Pain control, post dural puncture headache, nerve damage, paralysis, blood pressure Changes, nausea, vomiting, reactions to medications-both toxic and allergic and post Partum back pain. All questions were answered. Patient expressed understanding and wished to proceed. Sterile technique was used throughout procedure. Epidural site was Dressed with sterile barrier dressing. No paresthesias, signs of intravascular injection Or signs of intrathecal spread were encountered.  Patient was more comfortable after the epidural was dosed. Please see RN's note for documentation of vital signs and FHR which are stable.

## 2016-11-15 NOTE — Anesthesia Preprocedure Evaluation (Addendum)
Anesthesia Evaluation  Patient identified by MRN, date of birth, ID band Patient awake    Reviewed: Allergy & Precautions, NPO status , Patient's Chart, lab work & pertinent test results  Airway Mallampati: II  TM Distance: >3 FB Neck ROM: Full    Dental no notable dental hx. (+) Teeth Intact   Pulmonary neg pulmonary ROS,    Pulmonary exam normal breath sounds clear to auscultation       Cardiovascular negative cardio ROS Normal cardiovascular exam Rhythm:Regular Rate:Normal     Neuro/Psych negative neurological ROS  negative psych ROS   GI/Hepatic negative GI ROS, Neg liver ROS,   Endo/Other    Renal/GU negative Renal ROS  negative genitourinary   Musculoskeletal negative musculoskeletal ROS (+)   Abdominal   Peds  Hematology negative hematology ROS (+)   Anesthesia Other Findings   Reproductive/Obstetrics (+) Pregnancy                            Anesthesia Physical Anesthesia Plan  ASA: II and emergent  Anesthesia Plan: Epidural   Post-op Pain Management:    Induction:   PONV Risk Score and Plan:   Airway Management Planned: Natural Airway  Additional Equipment:   Intra-op Plan:   Post-operative Plan:   Informed Consent: I have reviewed the patients History and Physical, chart, labs and discussed the procedure including the risks, benefits and alternatives for the proposed anesthesia with the patient or authorized representative who has indicated his/her understanding and acceptance.     Plan Discussed with: Anesthesiologist  Anesthesia Plan Comments: (C/Section for arrest of descent. Will use Epidural. M. Malen Gauze, MD)       Anesthesia Quick Evaluation

## 2016-11-15 NOTE — MAU Note (Signed)
Pt ambulatory to triage from registration with complaint of pressure and contractions. SVE in triage due to no room available in MAU, SVE 8/100/-1 BBOW, moderate bloody show. Call to [facility], RN for admission , call to dr Normand Sloop who is covering for Dr Richardson Dopp. Pt to 174 per wheelchair.

## 2016-11-15 NOTE — H&P (Signed)
Amber Richard is a 31 y.o. female presenting for labor . OB History    Gravida Para Term Preterm AB Living   1             SAB TAB Ectopic Multiple Live Births                 Past Medical History:  Diagnosis Date  . Medical history non-contributory    Past Surgical History:  Procedure Laterality Date  . WISDOM TOOTH EXTRACTION     Family History: family history includes Asthma in her mother; Colon cancer in her maternal grandfather; Depression in her maternal grandmother; Diabetes in her maternal grandfather; Heart disease in her maternal grandfather; Hypertension in her maternal grandfather. Social History:  reports that she has never smoked. She has never used smokeless tobacco. She reports that she does not drink alcohol or use drugs.     Maternal Diabetes: No Genetic Screening: Declined Maternal Ultrasounds/Referrals: Normal Fetal Ultrasounds or other Referrals:  None Maternal Substance Abuse:  No Significant Maternal Medications:  None Significant Maternal Lab Results:  None Other Comments:  None  ROS History Dilation: 8.5 Effacement (%): 100 Station: -1 Exam by:: Milus Glazier, RN Blood pressure 119/68, pulse 78, temperature 98 F (36.7 C), temperature source Oral, resp. rate 16, height  (1.651 m), weight 75.8 kg (167 lb), last menstrual period 08/07/2015. Exam Physical Exam  Physical Examination: General appearance - alert, well appearing, and in no distress Mental status - alert, oriented to person, place, and time Chest - clear to auscultation, no wheezes, rales or rhonchi, symmetric air entry Heart - normal rate and regular rhythm Abdomen - soft, nontender, nondistended, no masses or organomegaly gravid Extremities - Homan's sign negative bilaterally Prenatal labs: ABO, Rh: --/--/O POS (09/22 1700) Antibody: NEG (09/22 1700) Rubella: Immune (02/08 0000) RPR: Nonreactive (02/08 0000)  HBsAg: Negative (02/08 0000)  HIV: Non-reactive (02/08  0000)  GBS: Positive (08/22 0000)   Assessment/Plan: Active labor Ampicillin for GBS Anticipate SVD Epidural for pain   Amber Richard A 11/15/2016, 7:14 PM

## 2016-11-15 NOTE — MAU Note (Signed)
Notified Dr. Normand Sloop patient presents G1 [redacted]w[redacted]d 8-9 cm with BBOW, GBS positive, received admit orders.

## 2016-11-15 NOTE — Progress Notes (Addendum)
Amber Richard MRN: [id number]  Subjective: -Care assumed of 31 y.o. G1P0 at [redacted]w[redacted]d who presents for active labor.  Patient is under the care of [facility] and is attended by Dr. Delrae Sawyers.  Pregnancy and medical history significant for resolved choroid plexus cyst and positive GBS.   In room to meet acquaintance of patient and family.  Patient resting in bed and reports some intermittent rectal pressure.   Objective: BP 116/65   Pulse 64   Temp 98 F (36.7 C) (Oral)   Resp 16   Ht  (1.651 m)   Wt 75.8 kg (167 lb)   LMP 08/07/2015   BMI 27.79 kg/m  No intake/output data recorded. No intake/output data recorded.  Fetal Monitoring: FHT: 140 bpm, Mod Var, +Early Decels, +Accels UC: Q1-58min, palpates moderate    Physical Exam: General appearance: alert, well appearing, and in no distress. Chest: normal rate and regular rhythm.  clear to auscultation, no wheezes, rales or rhonchi, symmetric air entry. Abdominal exam: Soft RT, Gravid, NT, S<D. Extremities: No Edema Skin exam: Warm Dry  Vaginal Exam: SVE:   Dilation: Lip/rim Effacement (%): 100 Station: 0 Exam by:: amwalker,rn Membranes:AROM Internal Monitors: None  Augmentation/Induction: Pitocin:Initiated Cytotec: None  Assessment:  IUP at 40.3wks Cat I FT  Inadequate Contractions Labor Augmentation  Plan: -Start pitocin augmentation for contractions -Will reassess once patient having increased rectal pressure -Anticipate SVD -Continue other mgmt as ordered   Valma Cava, CNM 11/15/2016, 8:06 PM  Addendum Strip reviewed and decel noted.  In room to assess. Patient reports some rectal pressure with contractions.   135 bpm, Mod Var, +Prolonged Decels, +Accels Q1-69min, palpates moderate  Cat II FT Transitional Labor  Position change with resolution of deceleration Will continue to monitor   Cherre Robins MSN, CNM 11/15/2016 9:43 PM

## 2016-11-16 ENCOUNTER — Encounter (HOSPITAL_COMMUNITY)
Admission: AD | Disposition: A | Discharge status: Home / Self Care | Payer: Self-pay | Source: Ambulatory Visit | Attending: Obstetrics and Gynecology

## 2016-11-16 ENCOUNTER — Encounter (HOSPITAL_COMMUNITY): Payer: Self-pay

## 2016-11-16 LAB — RPR: RPR: NONREACTIVE

## 2016-11-16 SURGERY — Surgical Case
Anesthesia: Epidural

## 2016-11-16 MED ORDER — COMPLETENATE 29-1 MG PO CHEW
1.0000 | CHEWABLE_TABLET | Freq: Every day | ORAL | Status: DC
  Administered 2016-11-16 – 2016-11-19 (×4): 1 via ORAL
  Filled 2016-11-16 (×5): qty 1

## 2016-11-16 MED ORDER — OXYCODONE HCL 5 MG PO TABS
10.0000 mg | ORAL_TABLET | ORAL | Status: AC | PRN
  Administered 2016-11-16 – 2016-11-17 (×3): 10 mg via ORAL
  Filled 2016-11-16 (×3): qty 2

## 2016-11-16 MED ORDER — METHYLERGONOVINE MALEATE 0.2 MG/ML IJ SOLN
INTRAMUSCULAR | Status: DC | PRN
  Administered 2016-11-16: 0.2 mg via INTRAMUSCULAR

## 2016-11-16 MED ORDER — FENTANYL CITRATE (PF) 100 MCG/2ML IJ SOLN
INTRAMUSCULAR | Status: AC
  Filled 2016-11-16: qty 2

## 2016-11-16 MED ORDER — MORPHINE SULFATE (PF) 0.5 MG/ML IJ SOLN
INTRAMUSCULAR | Status: AC
  Filled 2016-11-16: qty 10

## 2016-11-16 MED ORDER — ONDANSETRON HCL 4 MG/2ML IJ SOLN
INTRAMUSCULAR | Status: AC
  Filled 2016-11-16: qty 2

## 2016-11-16 MED ORDER — NALBUPHINE HCL 10 MG/ML IJ SOLN
5.0000 mg | INTRAMUSCULAR | Status: DC | PRN
Start: 2016-11-16 — End: 2016-11-19

## 2016-11-16 MED ORDER — OXYTOCIN 10 UNIT/ML IJ SOLN
INTRAMUSCULAR | Status: AC
  Filled 2016-11-16: qty 4

## 2016-11-16 MED ORDER — NALBUPHINE HCL 10 MG/ML IJ SOLN: 5.0000 mg | Freq: Once | INTRAMUSCULAR | Status: DC | PRN

## 2016-11-16 MED ORDER — LACTATED RINGERS IV SOLN
INTRAVENOUS | Status: DC | PRN
  Administered 2016-11-16 (×3): via INTRAVENOUS

## 2016-11-16 MED ORDER — NALOXONE HCL 0.4 MG/ML IJ SOLN: 0.4000 mg | INTRAMUSCULAR | Status: DC | PRN

## 2016-11-16 MED ORDER — OXYTOCIN 40 UNITS IN LACTATED RINGERS INFUSION - SIMPLE MED: 2.5000 [IU]/h | INTRAVENOUS | Status: AC

## 2016-11-16 MED ORDER — PRENATAL MULTIVITAMIN CH
1.0000 | ORAL_TABLET | Freq: Every day | ORAL | Status: DC
  Filled 2016-11-16: qty 1

## 2016-11-16 MED ORDER — MENTHOL 3 MG MT LOZG: 1.0000 | LOZENGE | OROMUCOSAL | Status: DC | PRN

## 2016-11-16 MED ORDER — FENTANYL CITRATE (PF) 100 MCG/2ML IJ SOLN
INTRAMUSCULAR | Status: DC | PRN
  Administered 2016-11-16 (×2): 50 ug via INTRAVENOUS

## 2016-11-16 MED ORDER — SIMETHICONE 80 MG PO CHEW: 80.0000 mg | CHEWABLE_TABLET | ORAL | Status: DC | PRN

## 2016-11-16 MED ORDER — MEPERIDINE HCL 25 MG/ML IJ SOLN: 6.2500 mg | INTRAMUSCULAR | Status: DC | PRN

## 2016-11-16 MED ORDER — METOCLOPRAMIDE HCL 5 MG/ML IJ SOLN: 10.0000 mg | Freq: Once | INTRAMUSCULAR | Status: DC | PRN

## 2016-11-16 MED ORDER — ONDANSETRON HCL 4 MG/2ML IJ SOLN: 4.0000 mg | Freq: Three times a day (TID) | INTRAMUSCULAR | Status: DC | PRN

## 2016-11-16 MED ORDER — IBUPROFEN 100 MG/5ML PO SUSP
600.0000 mg | Freq: Four times a day (QID) | ORAL | Status: DC
  Administered 2016-11-16 – 2016-11-19 (×13): 600 mg via ORAL
  Filled 2016-11-16 (×17): qty 30

## 2016-11-16 MED ORDER — DIPHENHYDRAMINE HCL 50 MG/ML IJ SOLN: 12.5000 mg | INTRAMUSCULAR | Status: DC | PRN

## 2016-11-16 MED ORDER — WITCH HAZEL-GLYCERIN EX PADS: 1.0000 "application " | MEDICATED_PAD | CUTANEOUS | Status: DC | PRN

## 2016-11-16 MED ORDER — FENTANYL CITRATE (PF) 100 MCG/2ML IJ SOLN: 25.0000 ug | INTRAMUSCULAR | Status: DC | PRN

## 2016-11-16 MED ORDER — SIMETHICONE 80 MG PO CHEW
80.0000 mg | CHEWABLE_TABLET | ORAL | Status: DC
  Administered 2016-11-17 – 2016-11-18 (×3): 80 mg via ORAL
  Filled 2016-11-16 (×3): qty 1

## 2016-11-16 MED ORDER — IBUPROFEN 600 MG PO TABS
600.0000 mg | ORAL_TABLET | Freq: Four times a day (QID) | ORAL | Status: DC
  Filled 2016-11-16: qty 1

## 2016-11-16 MED ORDER — COCONUT OIL OIL
1.0000 "application " | TOPICAL_OIL | Status: DC | PRN
  Administered 2016-11-17: 1 via TOPICAL
  Filled 2016-11-16: qty 120

## 2016-11-16 MED ORDER — CEFAZOLIN SODIUM-DEXTROSE 2-3 GM-% IV SOLR
INTRAVENOUS | Status: DC | PRN
  Administered 2016-11-16: 1 g via INTRAVENOUS

## 2016-11-16 MED ORDER — PHENYLEPHRINE HCL 10 MG/ML IJ SOLN
INTRAMUSCULAR | Status: DC | PRN
  Administered 2016-11-16: 80 ug via INTRAVENOUS

## 2016-11-16 MED ORDER — SODIUM CHLORIDE 0.9% FLUSH: 3.0000 mL | INTRAVENOUS | Status: DC | PRN

## 2016-11-16 MED ORDER — KETOROLAC TROMETHAMINE 30 MG/ML IJ SOLN
30.0000 mg | Freq: Four times a day (QID) | INTRAMUSCULAR | Status: DC | PRN
  Administered 2016-11-16: 30 mg via INTRAMUSCULAR

## 2016-11-16 MED ORDER — SENNOSIDES-DOCUSATE SODIUM 8.6-50 MG PO TABS
2.0000 | ORAL_TABLET | ORAL | Status: DC
  Administered 2016-11-17 – 2016-11-18 (×3): 2 via ORAL
  Filled 2016-11-16 (×3): qty 2

## 2016-11-16 MED ORDER — SODIUM BICARBONATE 8.4 % IV SOLN
INTRAVENOUS | Status: DC | PRN
  Administered 2016-11-16 (×3): 5 mL via EPIDURAL

## 2016-11-16 MED ORDER — ONDANSETRON HCL 4 MG/2ML IJ SOLN
INTRAMUSCULAR | Status: DC | PRN
  Administered 2016-11-16: 4 mg via INTRAVENOUS

## 2016-11-16 MED ORDER — NALBUPHINE HCL 10 MG/ML IJ SOLN: 5.0000 mg | INTRAMUSCULAR | Status: DC | PRN

## 2016-11-16 MED ORDER — MORPHINE SULFATE (PF) 0.5 MG/ML IJ SOLN
INTRAMUSCULAR | Status: DC | PRN
  Administered 2016-11-16: 1 mg via INTRAVENOUS
  Administered 2016-11-16: 4 mg via EPIDURAL

## 2016-11-16 MED ORDER — TETANUS-DIPHTH-ACELL PERTUSSIS 5-2.5-18.5 LF-MCG/0.5 IM SUSP: 0.5000 mL | Freq: Once | INTRAMUSCULAR | Status: DC

## 2016-11-16 MED ORDER — LACTATED RINGERS IV SOLN
INTRAVENOUS | Status: DC
  Administered 2016-11-16: 10:00:00 via INTRAVENOUS

## 2016-11-16 MED ORDER — OXYCODONE HCL 5 MG PO TABS
5.0000 mg | ORAL_TABLET | ORAL | Status: DC | PRN
  Administered 2016-11-17 – 2016-11-19 (×6): 5 mg via ORAL
  Filled 2016-11-16 (×6): qty 1

## 2016-11-16 MED ORDER — KETOROLAC TROMETHAMINE 30 MG/ML IJ SOLN: 30.0000 mg | Freq: Four times a day (QID) | INTRAMUSCULAR | Status: DC | PRN

## 2016-11-16 MED ORDER — DIBUCAINE 1 % RE OINT: 1.0000 "application " | TOPICAL_OINTMENT | RECTAL | Status: DC | PRN

## 2016-11-16 MED ORDER — SCOPOLAMINE 1 MG/3DAYS TD PT72
MEDICATED_PATCH | TRANSDERMAL | Status: DC | PRN
  Administered 2016-11-16: 1 via TRANSDERMAL

## 2016-11-16 MED ORDER — MEPERIDINE HCL 25 MG/ML IJ SOLN
INTRAMUSCULAR | Status: AC
  Filled 2016-11-16: qty 1

## 2016-11-16 MED ORDER — MEPERIDINE HCL 25 MG/ML IJ SOLN
INTRAMUSCULAR | Status: DC | PRN
  Administered 2016-11-16: 25 mg via INTRAVENOUS

## 2016-11-16 MED ORDER — SIMETHICONE 80 MG PO CHEW
80.0000 mg | CHEWABLE_TABLET | Freq: Three times a day (TID) | ORAL | Status: DC
  Administered 2016-11-16 – 2016-11-18 (×8): 80 mg via ORAL
  Filled 2016-11-16 (×9): qty 1

## 2016-11-16 MED ORDER — SCOPOLAMINE 1 MG/3DAYS TD PT72
MEDICATED_PATCH | TRANSDERMAL | Status: AC
  Filled 2016-11-16: qty 1

## 2016-11-16 MED ORDER — DIPHENHYDRAMINE HCL 25 MG PO CAPS: 25.0000 mg | ORAL_CAPSULE | ORAL | Status: DC | PRN

## 2016-11-16 MED ORDER — NALOXONE HCL 2 MG/2ML IJ SOSY
1.0000 ug/kg/h | PREFILLED_SYRINGE | INTRAMUSCULAR | Status: DC | PRN
  Filled 2016-11-16: qty 2

## 2016-11-16 MED ORDER — DIPHENHYDRAMINE HCL 25 MG PO CAPS: 25.0000 mg | ORAL_CAPSULE | Freq: Four times a day (QID) | ORAL | Status: DC | PRN

## 2016-11-16 MED ORDER — ZOLPIDEM TARTRATE 5 MG PO TABS: 5.0000 mg | ORAL_TABLET | Freq: Every evening | ORAL | Status: DC | PRN

## 2016-11-16 MED ORDER — ACETAMINOPHEN 325 MG PO TABS
650.0000 mg | ORAL_TABLET | ORAL | Status: DC | PRN
  Administered 2016-11-18: 650 mg via ORAL
  Filled 2016-11-16 (×2): qty 2

## 2016-11-16 MED ORDER — STERILE WATER FOR IRRIGATION IR SOLN
Status: DC | PRN
  Administered 2016-11-16: 1000 mL

## 2016-11-16 MED ORDER — KETOROLAC TROMETHAMINE 30 MG/ML IJ SOLN
INTRAMUSCULAR | Status: AC
Start: 2016-11-16 — End: 2016-11-16
  Filled 2016-11-16: qty 1

## 2016-11-16 SURGICAL SUPPLY — 37 items
APL SKNCLS STERI-STRIP NONHPOA (GAUZE/BANDAGES/DRESSINGS) ×1
BENZOIN TINCTURE PRP APPL 2/3 (GAUZE/BANDAGES/DRESSINGS) ×2 IMPLANT
CHLORAPREP W/TINT 26ML (MISCELLANEOUS) ×2 IMPLANT
CLAMP CORD UMBIL (MISCELLANEOUS) IMPLANT
CLOTH BEACON ORANGE TIMEOUT ST (SAFETY) ×2 IMPLANT
CLSR STERI-STRIP ANTIMIC 1/2X4 (GAUZE/BANDAGES/DRESSINGS) ×1 IMPLANT
CONTAINER PREFILL 10% NBF 15ML (MISCELLANEOUS) IMPLANT
DRAIN JACKSON PRT FLT 10 (DRAIN) IMPLANT
DRSG OPSITE POSTOP 4X10 (GAUZE/BANDAGES/DRESSINGS) ×2 IMPLANT
ELECT REM PT RETURN 9FT ADLT (ELECTROSURGICAL) ×2
ELECTRODE REM PT RTRN 9FT ADLT (ELECTROSURGICAL) ×1 IMPLANT
EVACUATOR SILICONE 100CC (DRAIN) IMPLANT
EXTRACTOR VACUUM M CUP 4 TUBE (SUCTIONS) IMPLANT
GLOVE BIO SURGEON STRL SZ 6.5 (GLOVE) ×2 IMPLANT
GLOVE BIOGEL PI IND STRL 7.0 (GLOVE) ×2 IMPLANT
GLOVE BIOGEL PI INDICATOR 7.0 (GLOVE) ×2
GOWN STRL REUS W/TWL LRG LVL3 (GOWN DISPOSABLE) ×4 IMPLANT
HEMOSTAT SURGICEL 2X14 (HEMOSTASIS) ×1 IMPLANT
KIT ABG SYR 3ML LUER SLIP (SYRINGE) IMPLANT
NDL HYPO 25X5/8 SAFETYGLIDE (NEEDLE) IMPLANT
NEEDLE HYPO 25X5/8 SAFETYGLIDE (NEEDLE) IMPLANT
NS IRRIG 1000ML POUR BTL (IV SOLUTION) ×2 IMPLANT
PACK C SECTION WH (CUSTOM PROCEDURE TRAY) ×2 IMPLANT
PAD OB MATERNITY 4.3X12.25 (PERSONAL CARE ITEMS) ×2 IMPLANT
PENCIL SMOKE EVAC W/HOLSTER (ELECTROSURGICAL) ×2 IMPLANT
RTRCTR C-SECT PINK 25CM LRG (MISCELLANEOUS) IMPLANT
STRIP CLOSURE SKIN 1/2X4 (GAUZE/BANDAGES/DRESSINGS) ×2 IMPLANT
SUT CHROMIC 0 CT 1 (SUTURE) ×2 IMPLANT
SUT MNCRL AB 3-0 PS2 27 (SUTURE) ×2 IMPLANT
SUT PLAIN 2 0 (SUTURE) ×12
SUT PLAIN 2 0 XLH (SUTURE) ×2 IMPLANT
SUT PLAIN ABS 2-0 CT1 27XMFL (SUTURE) ×2 IMPLANT
SUT SILK 2 0 SH (SUTURE) IMPLANT
SUT VIC AB 0 CTX 36 (SUTURE) ×10
SUT VIC AB 0 CTX36XBRD ANBCTRL (SUTURE) ×4 IMPLANT
TOWEL OR 17X24 6PK STRL BLUE (TOWEL DISPOSABLE) ×2 IMPLANT
TRAY FOLEY BAG SILVER LF 14FR (SET/KITS/TRAYS/PACK) ×2 IMPLANT

## 2016-11-16 NOTE — Anesthesia Postprocedure Evaluation (Signed)
Anesthesia Post Note  Patient: Breeona Riano  Procedure(s) Performed: Procedure(s) (LRB): CESAREAN SECTION (N/A)     Patient location during evaluation: Mother Baby Anesthesia Type: Epidural Level of consciousness: awake and alert and oriented Pain management: pain level controlled Vital Signs Assessment: post-procedure vital signs reviewed and stable Respiratory status: spontaneous breathing and nonlabored ventilation Cardiovascular status: stable Postop Assessment: no headache, no apparent nausea or vomiting, no backache, adequate PO intake, epidural receding and patient able to bend at knees Anesthetic complications: no    Last Vitals:  Vitals:   11/16/16 0750 11/16/16 0805  BP: 113/70 (!) 108/58  Pulse:  (!) 56  Resp: 14 16  Temp:  36.7 C  SpO2:      Last Pain:  Vitals:   11/16/16 0650  TempSrc: Oral  PainSc:    Pain Goal: Patients Stated Pain Goal: 5 (11/15/16 1713)               Laban Emperor

## 2016-11-16 NOTE — Lactation Note (Signed)
This note was copied from a baby's chart. Lactation Consultation Note  Patient Name: Amber Richard [id number] Date: 11/16/2016 Reason for consult: Initial assessment  With this first time mom and term baby, now 61 hours old. The baby had some respiratory distress after birth, was in CNS, but now at 8 hours of age, is symptom free. Mom shown how to position for football hold, and how to hand express. Mom has easily expressed thick white colostrum. The baby latches eagerly, with strong, rythmic scukles, and lots of breast pulling noted. Basic breast feeding teaching begun, and lactation services briefly reviewed. Mom knows to call for questions/concerns   Maternal Data Formula Feeding for Exclusion: No Has patient been taught Hand Expression?: Yes  Feeding Feeding Type: Breast Fed Length of feed:  (20 - started at 2 pm - still feeding when I left the room)  LATCH Score Latch: Grasps breast easily, tongue down, lips flanged, rhythmical sucking.  Audible Swallowing: A few with stimulation (easily hand expressed colostrum)  Type of Nipple: Everted at rest and after stimulation  Comfort (Breast/Nipple): Soft / non-tender  Hold (Positioning): Assistance needed to correctly position infant at breast and maintain latch.  LATCH Score: 8  Interventions Interventions: Breast feeding basics reviewed;Assisted with latch;Skin to skin;Hand express;Adjust position;Support pillows;Position options;Expressed milk  Lactation Tools Discussed/Used     Consult Status Consult Status: Follow-up Date: 11/17/16 Follow-up type: In-patient    Alfred Levins 11/16/2016, 2:24 PM

## 2016-11-16 NOTE — Op Note (Signed)
Cesarean Section Procedure Note   Amber Richard  11/15/2016 - 11/16/2016  Indications: Dystocia and Arrest of Descent   Pre-operative Diagnosis: CESAREAN SECTION.   Post-operative Diagnosis: Same   Surgeon: Surgeon(s) and Role:    * Jaymes Graff, MD - Primary   Assistants: Gerrit Heck CNM   Anesthesia: epidural   Procedure Details:  The patient was seen in the Holding Room. The risks, benefits, complications, treatment options, and expected outcomes were discussed with the patient.  The patient was given the option of CS vs VAVD.  R&B of both were discussed.  Pt desires to have CS The patient concurred with the proposed plan, giving informed consent. identified as Amber Richard and the procedure verified as C-Section Delivery. A Time Out was held and the above information confirmed.  After induction of anesthesia, the patient was draped and prepped in the usual sterile manner. A transverse incision was made and carried down through the subcutaneous tissue to the fascia. Fascial incision was made in the midline and extended transversely. The fascia was separated from the underlying rectus muscle superiorly and inferiorly. The peritoneum was identified and entered. Peritoneal incision was extended longitudinally with good visualization of bowel and bladder. The utero-vesical peritoneal reflection was incised transversely and the bladder flap was bluntly freed from the lower uterine segment.  An alexsis retractor was placed in the abdomen.   A low transverse uterine incision was made. Delivered from cephalic presentation was a  infant, with Apgar scores of 8 at one minute and 9 at five minutes. Cord ph was ph 7.19 and 7.24 the umbilical cord was clamped and cut cord blood was obtained for evaluation. The placenta was removed Intact and appeared normal. The uterine outline, tubes and ovaries appeared normal}. The uterine incision was closed with running locked sutures of 0Vicryl. A second  layer 0 vicrlyl was used to imbricate the uterine incision    Hemostasis was observed. Lavage was carried out until clear. The alexsis was removed.  The peritoneum was closed with 0 chromic.  The muscles were examined and any bleeders were made hemostatic using bovie cautery device.   The fascia was then reapproximated with running sutures of 0 vicryl.  The subcutaneous tissue was reapproximated  With interrupted stitches using 2-0 plain gut. The subcuticular closure was performed using 3-31monocryl     Instrument, sponge, and needle counts were correct prior the abdominal closure and were correct at the conclusion of the case.    Findings: infant was delivered from vtx presentation. The fluid was thick meconium.  The uterus tubes and ovaries appeared normal.     Estimated Blood Loss: 1115cc   Total IV Fluids:   Urine Output: 1050CC OF bloody urine.  It was bloody before the CS  Specimens: placenta to pathology  Complications: no complications  Disposition: PACU - hemodynamically stable.   Maternal Condition: stable   Baby condition / location:  Couplet care / Skin to Skin  Attending Attestation: I performed the procedure.   Signed: Surgeon(s): Jaymes Graff, MD

## 2016-11-16 NOTE — Transfer of Care (Signed)
Immediate Anesthesia Transfer of Care Note  Patient: Amber Richard  Procedure(s) Performed: Procedure(s): CESAREAN SECTION (N/A)  Patient Location: PACU  Anesthesia Type:Epidural  Level of Consciousness: awake and alert   Airway & Oxygen Therapy: Patient Spontanous Breathing  Post-op Assessment: Report given to RN and Post -op Vital signs reviewed and stable  Post vital signs: Reviewed  Last Vitals:  Vitals:   11/16/16 0430 11/16/16 0500  BP: 106/66 128/65  Pulse: 67 74  Resp:    Temp:  37.1 C    Last Pain:  Vitals:   11/16/16 0500  TempSrc: Oral  PainSc:       Patients Stated Pain Goal: 5 (11/15/16 1713)  Complications: No apparent anesthesia complications

## 2016-11-16 NOTE — Anesthesia Postprocedure Evaluation (Signed)
Anesthesia Post Note  Patient: Amber Richard  Procedure(s) Performed: Procedure(s) (LRB): CESAREAN SECTION (N/A)     Patient location during evaluation: PACU Anesthesia Type: Epidural Level of consciousness: awake and alert and oriented Pain management: pain level controlled Vital Signs Assessment: post-procedure vital signs reviewed and stable Respiratory status: spontaneous breathing, nonlabored ventilation and respiratory function stable Cardiovascular status: blood pressure returned to baseline and stable Postop Assessment: no headache, no backache, epidural receding, patient able to bend at knees, no apparent nausea or vomiting and adequate PO intake Anesthetic complications: no    Last Vitals:  Vitals:   11/16/16 0720 11/16/16 0730  BP:  112/68  Pulse: 66 60  Resp: 18 17  Temp:    SpO2: 100%     Last Pain:  Vitals:   11/16/16 0650  TempSrc: Oral  PainSc:    Pain Goal: Patients Stated Pain Goal: 5 (11/15/16 1713)               Gwynne Edinger

## 2016-11-16 NOTE — Progress Notes (Signed)
Amber Richard MRN: [id number]  Subjective: -Patient pushing with contractions.    Objective: BP 118/65   Pulse (!) 59   Temp 98.6 F (37 C) (Oral)   Resp 16   Ht  (1.651 m)   Wt 75.8 kg (167 lb)   LMP 08/07/2015   BMI 27.79 kg/m  No intake/output data recorded. Total I/O In: -  Out: 500 [Urine:500]  Fetal Monitoring: FHT: 150 bpm, Mod Var, +Variable Decels, +Accels UC: Q13min    Vaginal Exam: SVE:   Dilation: 10 Effacement (%): 100 Station: +1 Exam by:: amwalker,rn Membranes:AROM Internal Monitors: None  Augmentation/Induction: Pitocin:24mUn/min Cytotec: None  Assessment:  IUP at 40.4wks Cat II FT  2nd Stage Labor MSAF  Plan: -Discussed minimal progress despite pushing -Patient requests to continue pushing  -Discussed pushing x 1 hour and then reevaluate -Discussed usage of vacuum extraction with fetal descent or possibility of cesarean section -Will reassess at 0430 or earlier as appropriate -Continue other mgmt as ordered   Valma Cava, CNM 11/16/2016, 3:21 AM   Addendum 0422 In room to assess.  No further progress made with pushing.  Patient reporting fatigue  Station: +1 with caput  Dr. Dorris Carnes. Dillard contacted and updated on patient status En route to assess and recommend next step in POC as appropriate.  Cherre Robins MSN, CNM 11/16/2016 4:37 AM

## 2016-11-17 ENCOUNTER — Encounter (HOSPITAL_COMMUNITY): Payer: Self-pay | Admitting: Obstetrics and Gynecology

## 2016-11-17 LAB — CBC
HEMATOCRIT: 26.5 % — AB (ref 36.0–46.0)
HEMOGLOBIN: 9.1 g/dL — AB (ref 12.0–15.0)
MCH: 30.2 pg (ref 26.0–34.0)
MCHC: 34.3 g/dL (ref 30.0–36.0)
MCV: 88 fL (ref 78.0–100.0)
Platelets: 103 10*3/uL — ABNORMAL LOW (ref 150–400)
RBC: 3.01 MIL/uL — AB (ref 3.87–5.11)
RDW: 14.1 % (ref 11.5–15.5)
WBC: 10.1 10*3/uL (ref 4.0–10.5)

## 2016-11-17 NOTE — Addendum Note (Signed)
Addendum  created 11/17/16 0810 by Shanon Payor, CRNA   Sign clinical note

## 2016-11-17 NOTE — Progress Notes (Signed)
Subjective: Postpartum Day 1: Cesarean Delivery Patient reports incisional pain and tolerating PO.    Objective: Vital signs in last 24 hours: Temp:  [97.7 F (36.5 C)-98.1 F (36.7 C)] 98.1 F (36.7 C) (09/24 0400) Pulse Rate:  [57-61] 61 (09/24 0400) Resp:  [16-18] 16 (09/24 0400) BP: (88-101)/(48-60) 88/48 (09/24 0400)  Physical Exam:  General: alert, cooperative and no distress Lochia: appropriate Uterine Fundus: firm Incision: healing well DVT Evaluation: No evidence of DVT seen on physical exam.   Recent Labs  11/15/16 1700 11/17/16 0555  HGB 13.6 9.1*  HCT 38.5 26.5*    Assessment/Plan: Status post Cesarean section. Doing well postoperatively.  Continue current care Awaiting void Encouraged ambulation in halls TID  Breastfeeding. Jessee Avers. 11/17/2016, 5:11 PM

## 2016-11-17 NOTE — Plan of Care (Signed)
Problem: Activity: Goal: Ability to tolerate increased activity will improve Outcome: Not Met (add Reason) Variance: Slow or unresponsive to therapy Comments: Pt pain increases with activity, unable to ambulate to BR

## 2016-11-17 NOTE — Anesthesia Postprocedure Evaluation (Signed)
Anesthesia Post Note  Patient: Amber Richard  Procedure(s) Performed: Procedure(s) (LRB): CESAREAN SECTION (N/A)     Patient location during evaluation: Mother Baby Anesthesia Type: Epidural Level of consciousness: awake and alert and oriented Pain management: pain level controlled Vital Signs Assessment: post-procedure vital signs reviewed and stable Respiratory status: spontaneous breathing and nonlabored ventilation Cardiovascular status: stable Postop Assessment: no headache, no backache, patient able to bend at knees, no signs of nausea or vomiting and adequate PO intake Anesthetic complications: no    Last Vitals:  Vitals:   11/16/16 2200 11/17/16 0400  BP: (!) 99/56 (!) 88/48  Pulse: (!) 57 61  Resp: 18 16  Temp: 36.6 C 36.7 C  SpO2:      Last Pain:  Vitals:   11/17/16 0647  TempSrc:   PainSc: 6    Pain Goal: Patients Stated Pain Goal: 3 (11/17/16 0647)               Madison Hickman

## 2016-11-18 NOTE — Lactation Note (Signed)
This note was copied from a baby's chart. Lactation Consultation Note  Patient Name: Amber Richard [id number]'[id number] Date: 11/18/2016 Reason for consult: Follow-up assessment Baby at 61 hr of life. Upon entry baby was sleeping sts with mom. Mom stated baby just bf for an hour. She is worried about supply. Encouraged her to put baby to breast on demand, post express, and spoon feed per volume guidelines. Mom stated she can manually express and has seen colostrum. She has a Medela PIS that she requested lactation show her how to use. Discussed baby behavior, feeding frequency, baby belly size, voids, wt loss, breast changes, and nipple care. She is aware of lactation services and support group. She will call at the next bf for a latch check.    Maternal Data    Feeding Feeding Type: Breast Fed Length of feed: 45 min  LATCH Score                   Interventions    Lactation Tools Discussed/Used Pump Review: Setup, frequency, and cleaning;Milk Storage;Other (comment) (pump settings)   Consult Status Consult Status: Follow-up Date: 11/19/16 Follow-up type: In-patient    Rulon Eisenmenger 11/18/2016, 6:51 PM

## 2016-11-18 NOTE — Progress Notes (Signed)
Subjective: Postpartum Day 2: Cesarean Delivery Patient reports incisional pain, tolerating PO, + flatus and no problems voiding.    Objective: Vital signs in last 24 hours: Temp:  [98.4 F (36.9 C)-98.6 F (37 C)] 98.6 F (37 C) (09/25 0533) Pulse Rate:  [59-78] 59 (09/25 0533) Resp:  [18] 18 (09/25 0533) BP: (112-113)/(68-69) 112/68 (09/25 0533) SpO2:  [100 %] 100 % (09/24 1736)  Physical Exam:  General: alert, cooperative and no distress Lochia: appropriate Uterine Fundus: firm Incision: healing well DVT Evaluation: No evidence of DVT seen on physical exam.   Recent Labs  11/15/16 1700 11/17/16 0555  HGB 13.6 9.1*  HCT 38.5 26.5*    Assessment/Plan: Status post Cesarean section. Doing well postoperatively.  Continue current care Encouraged ambulation TID  Possible d/c home tomorrow .  Sherrell Weir J. 11/18/2016, 10:09 AM

## 2016-11-19 ENCOUNTER — Inpatient Hospital Stay (HOSPITAL_COMMUNITY): Admission: RE | Admit: 2016-11-19 | Payer: BC Managed Care – PPO | Source: Ambulatory Visit

## 2016-11-19 MED ORDER — IBUPROFEN 100 MG/5ML PO SUSP: 600.0000 mg | Freq: Four times a day (QID) | ORAL | 1 refills | Status: DC | PRN

## 2016-11-19 MED ORDER — OXYCODONE HCL 5 MG PO TABS: ORAL_TABLET | ORAL | 0 refills | Status: DC

## 2016-11-19 NOTE — Discharge Summary (Signed)
OB Discharge Summary     Patient Name: Amber Richard DOB: 01-16-1986 MRN: [id number]  Date of admission: 11/15/2016 Delivering MD: Jaymes Graff   Date of discharge: 11/19/2016  Admitting diagnosis: 40 WKS, CTXS Intrauterine pregnancy: [redacted]w[redacted]d     Secondary diagnosis:  Active Problems:   Normal labor  Additional problems: None     Discharge diagnosis: Term Pregnancy Delivered                                                                                                Post partum procedures:None  Augmentation: None  Complications: None  Hospital course:  Onset of Labor With Unplanned C/S  [name] at [redacted]w[redacted]d was admitted in Active Labor on 11/15/2016. Patient had a labor course significant for arrest of descent . Membrane Rupture Time/Date: 6:43 PM ,11/15/2016   The patient went for cesarean section due to Arrest of Descent, and delivered a Viable infant,11/16/2016  Details of operation can be found in separate operative note. Patient had an uncomplicated postpartum course.  She is ambulating,tolerating a regular diet, passing flatus, and urinating well.  Patient is discharged home in stable condition 11/19/16.  Physical exam  Vitals:   11/17/16 1736 11/18/16 0533 11/18/16 1758 11/19/16 0532  BP: 113/69 112/68 (!) 114/58 107/70  Pulse: 78 (!) 59 64 (!) 55  Resp: Temp: 98.4 F (36.9 C) 98.6 F (37 C) 99.6 F (37.6 C) 97.7 F (36.5 C)  TempSrc: Oral Oral Oral Oral  SpO2: 100%     Weight:      Height:       General: alert, cooperative and no distress Lochia: appropriate Uterine Fundus: firm Incision: Healing well with no significant drainage, Dressing is clean, dry, and intact DVT Evaluation: No evidence of DVT seen on physical exam. Labs: Lab Results  Component Value Date   WBC 10.1 11/17/2016   HGB 9.1 (L) 11/17/2016   HCT 26.5 (L) 11/17/2016   MCV 88.0 11/17/2016   PLT 103 (L) 11/17/2016   CMP Latest Ref Rng & Units 03/27/2011  Glucose  70 - 99 mg/dL 82  BUN 6 - 23 mg/dL 16  Creatinine 4.09 - 8.11 mg/dL 9.14  Sodium 782 - 956 mEq/L 138  Potassium 3.5 - 5.1 mEq/L 3.8  Chloride 96 - 112 mEq/L 102  CO2 19 - 32 mEq/L 26  Calcium 8.4 - 10.5 mg/dL 9.5  Total Protein 6.0 - 8.3 g/dL 7.1  Total Bilirubin 0.3 - 1.2 mg/dL 0.7  Alkaline Phos 39 - 117 U/L 61  AST 0 - 37 U/L 23  ALT 0 - 35 U/L 18    Discharge instruction: per After Visit Summary and "Baby and Me Booklet".  After visit meds:  Allergies as of 11/19/2016      Reactions   Latex Rash      Medication List    STOP taking these medications   amoxicillin 400 MG/5ML suspension Commonly known as:  AMOXIL   B-6 PO   folic acid 400 MCG tablet Commonly known as:  FOLVITE     TAKE  these medications   calcium carbonate 500 MG chewable tablet Commonly known as:  TUMS - dosed in mg elemental calcium Chew 1,000 mg by mouth daily as needed for indigestion or heartburn.   ibuprofen 100 MG/5ML suspension Commonly known as:  ADVIL,MOTRIN Take 30 mLs (600 mg total) by mouth every 6 (six) hours as needed for mild pain.   loratadine 10 MG tablet Commonly known as:  CLARITIN Take 10 mg by mouth daily.   oxyCODONE 5 MG immediate release tablet Commonly known as:  Oxy IR/ROXICODONE 1-2 po every 6 hours as needed for moderate to severe pain   polyethylene glycol packet Commonly known as:  MIRALAX / GLYCOLAX Take 17 g by mouth daily as needed for mild constipation.   prenatal multivitamin Tabs tablet Take 1 tablet by mouth daily at 12 noon.            Discharge Care Instructions        Start     Ordered   11/19/16 0000  ibuprofen (ADVIL,MOTRIN) 100 MG/5ML suspension  Every 6 hours PRN     11/19/16 0829   11/19/16 0000  oxyCODONE (OXY IR/ROXICODONE) 5 MG immediate release tablet     11/19/16 0829   11/19/16 0000  Call MD for:  temperature >100.4     11/19/16 0829   11/19/16 0000  Call MD for:  persistant nausea and vomiting     11/19/16 0829   11/19/16  0000  Call MD for:  severe uncontrolled pain     11/19/16 0829   11/19/16 0000  Call MD for:  redness, tenderness, or signs of infection (pain, swelling, redness, odor or green/yellow discharge around incision site)     11/19/16 0829   11/19/16 0000  Activity as tolerated     11/19/16 0829   11/19/16 0000  Lifting restrictions    Comments:  Weight restriction of 10 lbs.   11/19/16 0829   11/19/16 0000  Driving restriction     Comments:  Avoid driving for at least 2 weeks.   11/19/16 0829   11/19/16 0000  Sexual acrtivity    Comments:  Avoid sex for 6 weeks   11/19/16 0829   11/19/16 0000   Remove dressing in 72 hours     11/19/16 0829   11/19/16 0000  Diet general     11/19/16 0829      Diet: routine diet  Activity: Advance as tolerated. Pelvic rest for 6 weeks.   Outpatient follow up:2 weeks Follow up Appt:No future appointments. Follow up Visit:No Follow-up on file.  Postpartum contraception: Not Discussed  Newborn Data: Live born female  Birth Weight: 7 lb 11.5 oz (3500 g) APGAR: 8, 9  Baby Feeding: Breast Disposition:home with mother   11/19/2016 Jessee Avers., MD

## 2016-12-03 ENCOUNTER — Encounter: Payer: Self-pay | Admitting: Family Medicine

## 2016-12-03 ENCOUNTER — Telehealth: Payer: Self-pay | Admitting: Family Medicine

## 2016-12-03 ENCOUNTER — Ambulatory Visit (INDEPENDENT_AMBULATORY_CARE_PROVIDER_SITE_OTHER): Payer: BC Managed Care – PPO | Admitting: Family Medicine

## 2016-12-03 VITALS — BP 108/70 | Temp 97.7°F | Wt 147.5 lb

## 2016-12-03 DIAGNOSIS — N10 Acute tubulo-interstitial nephritis: Secondary | ICD-10-CM

## 2016-12-03 DIAGNOSIS — R509 Fever, unspecified: Secondary | ICD-10-CM

## 2016-12-03 LAB — POCT URINALYSIS DIPSTICK
Spec Grav, UA: <=1.005 — AB (ref 1.010–1.025)
pH, UA: 7 (ref 5.0–8.0)

## 2016-12-03 MED ORDER — SULFAMETHOXAZOLE-TRIMETHOPRIM 800-160 MG PO TABS: ORAL_TABLET | ORAL | 0 refills | Status: DC

## 2016-12-03 NOTE — Telephone Encounter (Signed)
Left message return call 12/03/16

## 2016-12-03 NOTE — Addendum Note (Signed)
Addended by: Theodora Blow R on: 12/03/2016 04:07 PM   Modules accepted: Orders

## 2016-12-03 NOTE — Telephone Encounter (Signed)
Spoke with patient and informed her per Dr.Steve Luking- we are sending in Bactrim DS take 1 twice a day for 7 days. Also we are sending in urine for culture. Patient verbalized understanding.

## 2016-12-03 NOTE — Progress Notes (Signed)
   Subjective:    Patient ID: Amber Richard, female    DOB: Jul 23, 1985, 31 y.o.   MRN: [id number]  Fever   This is a new problem. The current episode started in the past 7 days. The maximum temperature noted was 102 to 102.9 F. Associated symptoms include muscle aches and nausea. Associated symptoms comments: Chills.   No runny nose no cough no ong  No one else sick last week  Some nause , no sore thre  Jan flu shot   Tylenol taking tow this morn    Took tye this morn     Review of Systems  Constitutional: Positive for fever.  Gastrointestinal: Positive for nausea.       Objective:   Physical Exam  Alert vitals stable, NAD. Blood pressure good on repeat. HEENT normal. Lungs clear. Heart regular rate and rhythm.   Urinalysis numerous white blood cells no echoes    Assessment & Plan:  Impression febrile illness. Viral versus post flu shot vrsus urinary tract. Substantary tract findings need to consider UTI source despite no true CVA tenderness. Antibiotics prescribed. Urine culture warning signs discussed

## 2016-12-03 NOTE — Telephone Encounter (Signed)
Bactrim ds bid seven days, culture urine

## 2016-12-03 NOTE — Telephone Encounter (Signed)
Please see patient's urine in the [facility] from today's office visit. Results were as follows.  Results for orders placed or performed in visit on 12/03/16  POCT urinalysis dipstick  Result Value Ref Range   Color, UA Yellow    Clarity, UA Cloudy    Glucose, UA     Bilirubin, UA     Ketones, UA     Spec Grav, UA <=1.005 (A) 1.010 - 1.025   Blood, UA Large    pH, UA 7.0 5.0 - 8.0   Protein, UA     Urobilinogen, UA  0.2 or 1.0 E.U./dL   Nitrite, UA     Leukocytes, UA Large (3+) (A) Negative

## 2016-12-05 ENCOUNTER — Ambulatory Visit (INDEPENDENT_AMBULATORY_CARE_PROVIDER_SITE_OTHER): Payer: BC Managed Care – PPO | Admitting: Family Medicine

## 2016-12-05 ENCOUNTER — Telehealth: Payer: Self-pay

## 2016-12-05 ENCOUNTER — Encounter: Payer: Self-pay | Admitting: Family Medicine

## 2016-12-05 VITALS — BP 104/70 | Temp 98.2°F | Wt 146.2 lb

## 2016-12-05 DIAGNOSIS — N10 Acute tubulo-interstitial nephritis: Secondary | ICD-10-CM | POA: Diagnosis not present

## 2016-12-05 DIAGNOSIS — R509 Fever, unspecified: Secondary | ICD-10-CM | POA: Diagnosis not present

## 2016-12-05 LAB — POCT URINALYSIS DIPSTICK
PH UA: 7 (ref 5.0–8.0)
RBC UA: NEGATIVE

## 2016-12-05 MED ORDER — CEFTRIAXONE SODIUM 500 MG IJ SOLR
500.0000 mg | Freq: Once | INTRAMUSCULAR | Status: AC
  Administered 2016-12-05: 500 mg via INTRAMUSCULAR

## 2016-12-05 MED ORDER — ONDANSETRON 4 MG PO TBDP: ORAL_TABLET | ORAL | 0 refills | Status: DC

## 2016-12-05 MED ORDER — CIPROFLOXACIN HCL 500 MG PO TABS: ORAL_TABLET | ORAL | 0 refills | Status: DC

## 2016-12-05 NOTE — Progress Notes (Signed)
   Subjective:    Patient ID: Amber Richard, female    DOB: [birth year], 31 y.o.   MRN: [id number]  Fever   This is a new problem. The current episode started in the past 7 days. Associated symptoms include headaches. Associated symptoms comments: Painful urination . She has tried acetaminophen (oxycodone, Bactrim ) for the symptoms.    Patient also has concerns of urinary hesitancy since leaving the hospital. Some dysuria at times.  Of note patient had an indwelling catheter during her C-section last week.  Patient notes ongoing right upper quadrant and right flank pain. Positive nausea. Positive diminished appetite.  Ongoing fevers.  Urinalysis positive for bacteria culture and sensitivity pending    Results for orders placed or performed in visit on 12/05/16  POCT urinalysis dipstick  Result Value Ref Range   Color, UA Yellow    Clarity, UA Cloudy    Glucose, UA     Bilirubin, UA     Ketones, UA     Spec Grav, UA <=1.005 (A) 1.010 - 1.025   Blood, UA Negative    pH, UA 7.0 5.0 - 8.0   Protein, UA     Urobilinogen, UA  0.2 or 1.0 E.U./dL   Nitrite, UA     Leukocytes, UA Large (3+) (A) Negative    Review of Systems  Constitutional: Positive for fever.  Neurological: Positive for headaches.       Objective:   Physical Exam  Alert and oriented, vitals reviewed and stable, NAD ENT-TM's and ext canals WNL bilat via otoscopic exam Soft palate, tonsils and post pharynx WNL via oropharyngeal exam Neck-symmetric, no masses; thyroid nonpalpable and nontender Pulmonary-no tachypnea or accessory muscle use; Clear without wheezes via auscultation Card--no abnrml murmurs, rhythm reg and rate WNL Carotid pulses symmetric, without bruits Mild malaise. Positive right CVA tenderness mild right upper quadrant tenderness      Assessment & Plan:  Impression 1 probable right pyelonephritis. Discussed at length. Warning signs discussed. Will add Cipro. Patient to hold off on  breast-feeding for now. Can continue to pump weight future culture results. Zofran when necessary. Warning signs discussed carefully. Also Rocephin injection today  Greater than 50% of this 25 minute face to face visit was spent in counseling and discussion and coordination of care regarding the above diagnosis/diagnosies

## 2016-12-05 NOTE — Telephone Encounter (Signed)
Prelim urine cx pos, but too early to know if current med is the best choice  May breast feed with  How high are temps? If substantially high, should probably re eval these eve to reassess urine and reassess pt and consider an antibiotic injection to boost the treatment

## 2016-12-05 NOTE — Telephone Encounter (Signed)
Pt states she is trying not too take the tylenol if she absolutely doesn't have to she ,says with out it she is constantly at 99.5 the higehest in the last 24 hours has been 101.Pt will schedule an appt this afternoon with Dr.Steve.

## 2016-12-05 NOTE — Telephone Encounter (Signed)
Patient called and states she is still running fever, and can not get it to go down with out taking Tylenol. She also says she was called in bactrim 800-160 mg one BID,wants to know if this is safe to take while breast feeding/pumping breast milk. Please advise.

## 2016-12-08 ENCOUNTER — Other Ambulatory Visit: Payer: Self-pay

## 2016-12-08 ENCOUNTER — Telehealth: Payer: Self-pay | Admitting: Family Medicine

## 2016-12-08 LAB — URINE CULTURE

## 2016-12-08 LAB — SPECIMEN STATUS REPORT

## 2016-12-08 MED ORDER — CEFUROXIME AXETIL 500 MG PO TABS
ORAL_TABLET | ORAL | 0 refills | Status: DC
Start: 2016-12-08 — End: 2016-12-08

## 2016-12-08 MED ORDER — CEFPROZIL 250 MG/5ML PO SUSR: ORAL | 0 refills | Status: DC

## 2016-12-08 NOTE — Telephone Encounter (Signed)
Received phone call from pharmacy stating that Ceftin that was prescribed for patient could not be crushed. Per Dr.Steve Luking verbal orders medication was changed to Cefzil 250/5 two teaspoons twice a day for 10 days.

## 2016-12-08 NOTE — Addendum Note (Signed)
Addended by: Jeralene Peters on: 12/08/2016 02:35 PM   Modules accepted: Orders

## 2017-04-07 ENCOUNTER — Ambulatory Visit: Payer: BC Managed Care – PPO | Admitting: Family Medicine

## 2017-04-07 ENCOUNTER — Encounter: Payer: Self-pay | Admitting: Family Medicine

## 2017-04-07 VITALS — BP 116/74 | Temp 99.0°F | Ht 65.0 in | Wt 138.4 lb

## 2017-04-07 DIAGNOSIS — R1013 Epigastric pain: Secondary | ICD-10-CM

## 2017-04-07 DIAGNOSIS — R110 Nausea: Secondary | ICD-10-CM | POA: Diagnosis not present

## 2017-04-07 DIAGNOSIS — F411 Generalized anxiety disorder: Secondary | ICD-10-CM | POA: Diagnosis not present

## 2017-04-07 DIAGNOSIS — F339 Major depressive disorder, recurrent, unspecified: Secondary | ICD-10-CM | POA: Diagnosis not present

## 2017-04-07 MED ORDER — ONDANSETRON 4 MG PO TBDP: 4.0000 mg | ORAL_TABLET | Freq: Three times a day (TID) | ORAL | 2 refills | Status: DC | PRN

## 2017-04-07 MED ORDER — CITALOPRAM HYDROBROMIDE 20 MG PO TABS: 20.0000 mg | ORAL_TABLET | Freq: Every day | ORAL | 3 refills | Status: DC

## 2017-04-07 NOTE — Progress Notes (Signed)
   Subjective:    Patient ID: Amber Richard, female    DOB: April 17, 1985, 32 y.o.   MRN: [id number]  HPI Pt. Has had loss of appetite for the past few months, esp after giving birth. In the last 5 day to one week, pt states that she can hardly keep anything down. Pt also has complaints of headache, chills, fatigue and abdominal pain. Is able to hold down crackers, Sprite, and chips.   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 No actual vomiting, but felt gagging   No br feeding     Benefited in the pst, helped calm down   feein gboth der and anxiius at times     Review of Systems No headache, no major weight loss or weight gain, no chest pain no back pain abdominal pain no change in bowel habits complete ROS otherwise negative     Objective:   Physical Exam Alert and oriented, vitals reviewed and stable, NAD ENT-TM's and ext canals WNL bilat via otoscopic exam Soft palate, tonsils and post pharynx WNL via oropharyngeal exam Neck-symmetric, no masses; thyroid nonpalpable and nontender Pulmonary-no tachypnea or accessory muscle use; Clear without wheezes via auscultation Card--no abnrml murmurs, rhythm reg and rate WNL Carotid pulses symmetric, without bruits Abdomen excellent bowel sounds diffusely mild tenderness in all quadrants  See PHQ 9 results     Assessment & Plan:  Impression chronic nausea and GI symptomatology since birth of baby.  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  Very long discussion held.  No he is having no depression and  generalized anxiety for a long time  3.  Generalized anxiety disorder substantial flare.  Long discussion L.    4.  Acute GI symptomatology superimposed patient may well have a viral infection.  Discussed.  Zofran as needed.  Initiate Celexa rationale discussed.  Follow-up as scheduled.  Appropriate blood work and her blood work all came back fine  .25 Greater than 50% of this 40 minute face to face visit was spent in counseling and discussion and coordination of care regarding the above diagnosis/diagnosies Patient

## 2017-04-08 LAB — CBC
HEMATOCRIT: 41.6 % (ref 34.0–46.6)
Hemoglobin: 13.9 g/dL (ref 11.1–15.9)
MCH: 26.8 pg (ref 26.6–33.0)
MCHC: 33.4 g/dL (ref 31.5–35.7)
MCV: 80 fL (ref 79–97)
PLATELETS: 183 10*3/uL (ref 150–379)
RBC: 5.18 x10E6/uL (ref 3.77–5.28)
RDW: 16.6 % — ABNORMAL HIGH (ref 12.3–15.4)
WBC: 4.6 10*3/uL (ref 3.4–10.8)

## 2017-04-08 LAB — BASIC METABOLIC PANEL
BUN/Creatinine Ratio: 12 (ref 9–23)
BUN: 10 mg/dL (ref 6–20)
CALCIUM: 8.8 mg/dL (ref 8.7–10.2)
CHLORIDE: 101 mmol/L (ref 96–106)
CO2: 23 mmol/L (ref 20–29)
Creatinine, Ser: 0.84 mg/dL (ref 0.57–1.00)
GFR calc non Af Amer: 94 mL/min/{1.73_m2} (ref >59)
GFR, EST AFRICAN AMERICAN: 108 mL/min/{1.73_m2} (ref >59)
Glucose: 88 mg/dL (ref 65–99)
POTASSIUM: 4.6 mmol/L (ref 3.5–5.2)
SODIUM: 139 mmol/L (ref 134–144)

## 2017-04-08 LAB — HEPATIC FUNCTION PANEL
ALT: 17 IU/L (ref 0–32)
AST: 20 IU/L (ref 0–40)
Albumin: 4.4 g/dL (ref 3.5–5.5)
Alkaline Phosphatase: 51 IU/L (ref 39–117)
Bilirubin Total: 0.5 mg/dL (ref 0.0–1.2)
Bilirubin, Direct: 0.15 mg/dL (ref 0.00–0.40)
Total Protein: 7.1 g/dL (ref 6.0–8.5)

## 2017-04-08 LAB — AMYLASE: Amylase: 65 U/L (ref 31–124)

## 2017-04-08 LAB — LIPASE: Lipase: 30 U/L (ref 14–72)

## 2017-04-08 MED ORDER — PROMETHAZINE HCL 25 MG RE SUPP: 25.0000 mg | Freq: Four times a day (QID) | RECTAL | 0 refills | Status: DC | PRN

## 2017-04-09 ENCOUNTER — Ambulatory Visit: Payer: BC Managed Care – PPO | Admitting: Family Medicine

## 2017-04-09 VITALS — BP 108/70 | Temp 98.9°F | Ht 65.0 in | Wt 134.0 lb

## 2017-04-09 DIAGNOSIS — R110 Nausea: Secondary | ICD-10-CM | POA: Diagnosis not present

## 2017-04-09 DIAGNOSIS — N390 Urinary tract infection, site not specified: Secondary | ICD-10-CM | POA: Diagnosis not present

## 2017-04-09 DIAGNOSIS — F411 Generalized anxiety disorder: Secondary | ICD-10-CM

## 2017-04-09 DIAGNOSIS — F339 Major depressive disorder, recurrent, unspecified: Secondary | ICD-10-CM | POA: Diagnosis not present

## 2017-04-09 LAB — POCT URINE PREGNANCY: PREG TEST UR: NEGATIVE

## 2017-04-09 LAB — POCT URINALYSIS DIPSTICK
Spec Grav, UA: <=1.005 — AB (ref 1.010–1.025)
Urobilinogen, UA: 4 E.U./dL — AB
pH, UA: 6 (ref 5.0–8.0)

## 2017-04-09 MED ORDER — OMEPRAZOLE 20 MG PO CPDR: 20.0000 mg | DELAYED_RELEASE_CAPSULE | ORAL | 3 refills | Status: DC

## 2017-04-09 MED ORDER — NITROFURANTOIN MONOHYD MACRO 100 MG PO CAPS: 100.0000 mg | ORAL_CAPSULE | Freq: Two times a day (BID) | ORAL | 0 refills | Status: DC

## 2017-04-09 NOTE — Progress Notes (Signed)
   Subjective:    Patient ID: Amber ShinesKristina Chrismer, female    DOB: November 20, 1985, 32 y.o.   MRN: [id number]  HPINausea ongoing. Tried zofran.   Results for orders placed or performed in visit on 04/09/17  POCT urinalysis dipstick  Result Value Ref Range   Color, UA     Clarity, UA     Glucose, UA     Bilirubin, UA ++    Ketones, UA     Spec Grav, UA <=1.005 (A) 1.010 - 1.025   Blood, UA     pH, UA 6.0 5.0 - 8.0   Protein, UA trace    Urobilinogen, UA 4.0 (A) 0.2 or 1.0 E.U./dL   Nitrite, UA +    Leukocytes, UA Moderate (2+) (A) Negative   Appearance     Odor     Nausea ongoing , Has had some chills patient continued to have nausea.  Last menstrual period 3 weeks ago.  This was normal., no fever.  Patient wanted to make sure that she was not pregnant.  Of note has had nausea pretty much since the day she had her last infant.  Symptoms though certainly this week.  Has started the medication for the postpartum depression/generalized anxiety disorder.  No dysuria no fever no chills.  Did have a fairly challenging urinary tract infection last fall.  She notes that  Results for orders placed or performed in visit on 04/09/17  POCT urinalysis dipstick  Result Value Ref Range   Color, UA     Clarity, UA     Glucose, UA     Bilirubin, UA ++    Ketones, UA     Spec Grav, UA <=1.005 (A) 1.010 - 1.025   Blood, UA     pH, UA 6.0 5.0 - 8.0   Protein, UA trace    Urobilinogen, UA 4.0 (A) 0.2 or 1.0 E.U./dL   Nitrite, UA +    Leukocytes, UA Moderate (2+) (A) Negative   Appearance     Odor    POCT urine pregnancy  Result Value Ref Range   Preg Test, Ur Negative Negative     Review of Systems No headache, no major weight loss or weight gain, no chest pain no back pain abdominal pain no change in bowel habits complete ROS otherwise negative     Objective:   Physical Exam  Alert and oriented, vitals reviewed and stable, NAD ENT-TM's and ext canals WNL bilat via otoscopic  exam Soft palate, tonsils and post pharynx WNL via oropharyngeal exam Neck-symmetric, no masses; thyroid nonpalpable and nontender Pulmonary-no tachypnea or accessory muscle use; Clear without wheezes via auscultation Card--no abnrml murmurs, rhythm reg and rate WNL Carotid pulses symmetric, without bruits Abdomen excellent bowel sounds throughout mild diffuse discomfort to deep palpation No CVA tenderness Urinalysis few white blood cells.  2-4 per high-power occasional epi     Assessment & Plan:  Impression complicated presentation.  Acute GI symptoms superimposed on chronic.  Most likely acute etiology is viral gastroenteritis though will cover for potential for urinary tract infection.  Will culture urine.  Initiate antibiotics.  Maintain Zofran.  Use Phenergan as needed.  Clear liquids encouraged.  Also having very substantial postpartum anxiety/depression.  Continue meds in this regard.  Warning signs discussed.  Family will do GI referral illness this likely is going to require some further testing disorder

## 2017-04-10 ENCOUNTER — Telehealth: Payer: Self-pay | Admitting: Family Medicine

## 2017-04-10 ENCOUNTER — Other Ambulatory Visit: Payer: Self-pay | Admitting: Family Medicine

## 2017-04-10 DIAGNOSIS — R109 Unspecified abdominal pain: Secondary | ICD-10-CM

## 2017-04-10 MED ORDER — ONDANSETRON HCL 8 MG PO TABS: ORAL_TABLET | ORAL | 2 refills | Status: DC

## 2017-04-10 NOTE — Telephone Encounter (Signed)
1.  I would recommend increasing Zofran instead of 4 mg I recommend 8 mg.  She may have a new prescription for 30 tablets 1 3 times daily as needed nausea she may have for refills #2 the lab work overall looked good.  As the patient is probably aware #3 with the persistent nausea Dr. Brett CanalesSteve recommended an appointment with gastroenterology. ( It is unlikely that the Celexa is causing the nausea because she was having this before she started the medication. ) So therefore gastroenterology is the next step in this process.  I would also recommend the patient have a ultrasound of the abdomen with attention to the right upper quadrant to make sure gallbladder region looks good.  May end up having to have a HIDA test as well.  Gastroenterology may even consider doing EGD.  Please have Enid DerryBrendale try to make the referral and a urgent referral finally please have patient give update early next week regarding how the patient is doing.  I recommend bland diet and plenty of liquids-follow-up if any ongoing troubles-hopefully will not have to go to the ER but may need to do so if worse

## 2017-04-10 NOTE — Telephone Encounter (Signed)
See office visit notes and blood work from 04/07/17 and 04/09/17

## 2017-04-10 NOTE — Telephone Encounter (Signed)
Spoke with patient; anxious about referrals. Pt understood directions and what we were attempting to work up

## 2017-04-10 NOTE — Telephone Encounter (Signed)
Patient has been seeing Dr. Brett CanalesSteve for nausea and stomach pains in the past week.  She said she is still weak and cant hold any food down.  She wants to know what Dr. Lorin PicketScott recommends?  She is worried because of the weekend coming up.

## 2017-04-13 ENCOUNTER — Telehealth: Payer: Self-pay | Admitting: Family Medicine

## 2017-04-13 ENCOUNTER — Other Ambulatory Visit (HOSPITAL_COMMUNITY): Payer: Self-pay | Admitting: Physician Assistant

## 2017-04-13 DIAGNOSIS — R1012 Left upper quadrant pain: Secondary | ICD-10-CM

## 2017-04-13 DIAGNOSIS — R1013 Epigastric pain: Secondary | ICD-10-CM

## 2017-04-13 DIAGNOSIS — R1031 Right lower quadrant pain: Secondary | ICD-10-CM

## 2017-04-13 LAB — URINE CULTURE

## 2017-04-13 LAB — SPECIMEN STATUS REPORT

## 2017-04-13 NOTE — Telephone Encounter (Signed)
[place] scheduled aph feb 21st 8:30 at [facility]. Register 8:15. NPO for 6 hours. Left message to return call to get more info on how pt was doing.

## 2017-04-13 NOTE — Telephone Encounter (Signed)
Pt verbalized understanding. States she will go to ED.

## 2017-04-13 NOTE — Telephone Encounter (Signed)
Patient would like to know the time and location of her upcoming ultrasound this week that we scheduled for her.  Also, if there are any special instructions.

## 2017-04-13 NOTE — Telephone Encounter (Signed)
Pt has not kept anything down the weekend. Per dr Brett Canalessteve. Hold off on scheduling US and pt needs to go to ED for fluids, bw and scan.

## 2017-04-13 NOTE — Telephone Encounter (Signed)
Pt states she is taking zofran and phenergan and they both knock her out for just a little bit and she is not keeping down anything. Abdominal pain is the same as when seen. Had a low grade fever a few days ago. U/S scheduled for Thursday. Pt would like today. No openings today when I called unless the order is changed to stat. Would you like to change to stat?

## 2017-04-13 NOTE — Progress Notes (Signed)
Dr. Brett CanalesSTeve now wants to cancel order for stat scan and recommends pt go to ED for possible fluids, bloodwork and scans. Left message for pt to return call. Also see result note.

## 2017-04-13 NOTE — Telephone Encounter (Signed)
Pt called stating that she is still unable to keep anything down and that she is starting to get real weak. Pt states that she was told that an ultrasound would be scheduled today and she is wanting to see if that can be done today. Please advise.

## 2017-04-13 NOTE — Telephone Encounter (Signed)
Ok to do stat and add bilateral kidney us stat also per dr Brett Canalessteve

## 2017-04-13 NOTE — Telephone Encounter (Signed)
Pt saw GI today and they ordered Ct abdomen and pelvis for Friday. Should pt still do us on Thursday or can it be canceled.

## 2017-04-14 NOTE — Telephone Encounter (Signed)
Did they know an u s was orderred? U s are actually better to define th e gallbladder than a ct, they may want both, maybe call their g I and ask

## 2017-04-14 NOTE — Telephone Encounter (Signed)
Limited RUQ US scheduled for Thursday. Called pt to find out which GI she saw. She sees [facility] [place] and saw the PA [place]. She states they were aware that she had an US RUQ scheduled and they wanted her to keep that appt because it would help with the precert of the CT scan. In the urine culture result you wanted to add bilateral renal us but it was never scheduled due to telling pt to go to ED. Does she still need to have Renal US added.

## 2017-04-14 NOTE — Telephone Encounter (Signed)
Left message to return call 

## 2017-04-14 NOTE — Telephone Encounter (Signed)
I would say no at this time , so yes lets do reg u s as ordered

## 2017-04-14 NOTE — Telephone Encounter (Signed)
Pt aware.

## 2017-04-16 ENCOUNTER — Ambulatory Visit (HOSPITAL_COMMUNITY)
Admission: RE | Admit: 2017-04-16 | Discharge: 2017-04-16 | Disposition: A | Discharge status: Home / Self Care | Payer: BC Managed Care – PPO | Source: Ambulatory Visit | Attending: Family Medicine | Admitting: Family Medicine

## 2017-04-16 DIAGNOSIS — R109 Unspecified abdominal pain: Secondary | ICD-10-CM | POA: Insufficient documentation

## 2017-04-16 NOTE — Progress Notes (Signed)
Left message on voicemail to return call.

## 2017-04-17 ENCOUNTER — Encounter (HOSPITAL_COMMUNITY): Payer: Self-pay

## 2017-04-17 ENCOUNTER — Ambulatory Visit (HOSPITAL_COMMUNITY)
Admission: RE | Admit: 2017-04-17 | Discharge: 2017-04-17 | Disposition: A | Discharge status: Home / Self Care | Payer: BC Managed Care – PPO | Source: Ambulatory Visit | Attending: Physician Assistant | Admitting: Physician Assistant

## 2017-04-17 DIAGNOSIS — R1011 Right upper quadrant pain: Secondary | ICD-10-CM | POA: Insufficient documentation

## 2017-04-17 DIAGNOSIS — R1013 Epigastric pain: Secondary | ICD-10-CM | POA: Insufficient documentation

## 2017-04-17 DIAGNOSIS — R1031 Right lower quadrant pain: Secondary | ICD-10-CM | POA: Diagnosis present

## 2017-04-17 DIAGNOSIS — R1012 Left upper quadrant pain: Secondary | ICD-10-CM | POA: Insufficient documentation

## 2017-04-17 MED ORDER — IOPAMIDOL (ISOVUE-300) INJECTION 61%
INTRAVENOUS | Status: AC
  Administered 2017-04-17: 100 mL
  Filled 2017-04-17: qty 100

## 2017-04-28 ENCOUNTER — Encounter: Payer: Self-pay | Admitting: Family Medicine

## 2017-04-28 ENCOUNTER — Ambulatory Visit: Payer: BC Managed Care – PPO | Admitting: Family Medicine

## 2017-04-28 VITALS — BP 128/84 | Ht 65.0 in | Wt 133.2 lb

## 2017-04-28 DIAGNOSIS — F411 Generalized anxiety disorder: Secondary | ICD-10-CM | POA: Diagnosis not present

## 2017-04-28 DIAGNOSIS — R109 Unspecified abdominal pain: Secondary | ICD-10-CM

## 2017-04-28 DIAGNOSIS — R300 Dysuria: Secondary | ICD-10-CM | POA: Diagnosis not present

## 2017-04-28 DIAGNOSIS — G8929 Other chronic pain: Secondary | ICD-10-CM

## 2017-04-28 DIAGNOSIS — F339 Major depressive disorder, recurrent, unspecified: Secondary | ICD-10-CM | POA: Diagnosis not present

## 2017-04-28 LAB — POCT URINALYSIS DIPSTICK
Spec Grav, UA: 1.02 (ref 1.010–1.025)
pH, UA: 5 (ref 5.0–8.0)

## 2017-04-28 MED ORDER — CIPROFLOXACIN HCL 250 MG PO TABS: 250.0000 mg | ORAL_TABLET | Freq: Two times a day (BID) | ORAL | 0 refills | Status: DC

## 2017-04-28 NOTE — Progress Notes (Signed)
   Subjective:    Patient ID: Amber Richard, female    DOB: January 27, 1986, 32 y.o.   MRN: [id number]  Urinary Tract Infection   This is a new problem. The current episode started in the past 7 days. Associated symptoms comments: Painful urination, burning while urinating, not urinating as ususal. Treatments tried: AZO.   Results for orders placed or performed in visit on 04/28/17  POCT Urinalysis Dipstick  Result Value Ref Range   Color, UA     Clarity, UA     Glucose, UA     Bilirubin, UA     Ketones, UA     Spec Grav, UA 1.020 1.010 - 1.025   Blood, UA     pH, UA 5.0 5.0 - 8.0   Protein, UA     Urobilinogen, UA  0.2 or 1.0 E.U./dL   Nitrite, UA     Leukocytes, UA  Negative   Appearance     Odor      Patient notes overall her depression and stress seem to be improving on the medication.Patient notes ongoing compliance with antidepressant medication. No obvious side effects. Reports does not miss a dose. Overall continues to help depression substantially. No thoughts of homicide or suicide. Would like to maintain medication.  No suicidal thoughts.  Patient notes dysuria.  No fever no chills.  No vomiting.  Slight nausea.  Concerned about another urinary tract infection.  Notes ongoing abdominal pain.  Diffuse in nature.  Saw gastroenterologist.  The fluid out acute difficulties with the CT scan but per patient history did not delve completely until all potential abdominal pain etiologies  Review of Systems No headache, no major weight loss or weight gain, no chest pain no back pain abdominal pain no change in bowel habits complete ROS otherwise negative     Objective:   Physical Exam  Alert and oriented, vitals reviewed and stable, NAD ENT-TM's and ext canals WNL bilat via otoscopic exam Soft palate, tonsils and post pharynx WNL via oropharyngeal exam Neck-symmetric, no masses; thyroid nonpalpable and nontender Pulmonary-no tachypnea or accessory muscle use; Clear  without wheezes via auscultation Card--no abnrml murmurs, rhythm reg and rate WNL Carotid pulses symmetric, without bruits  Urinalysis 2-3 white blood cells per high-power field impression 1 impression      Assessment & Plan:  1111 impression1 urinary tract infection.  Culture done.  Antibiotics prescribed symptom care discussed  2.  Chronic depression/anxiety.  Some improvement on medication.  Encouraged to maintain  3.  Chronic abdominal pain.  I advised patient that the thrombus is not completely assess her challenges in this regard.  I think the patient has a substantial element of irritable bowel syndrome but will need further testing no other potential etiologies discussed  Greater than 50% of this 25 minute face to face visit was spent in counseling and discussion and coordination of care regarding the above diagnosis/diagnosies

## 2017-04-30 LAB — URINE CULTURE: Organism ID, Bacteria: NO GROWTH

## 2017-05-05 ENCOUNTER — Ambulatory Visit: Payer: BC Managed Care – PPO | Admitting: Family Medicine

## 2017-05-07 ENCOUNTER — Ambulatory Visit: Payer: BC Managed Care – PPO | Admitting: Family Medicine

## 2017-05-22 ENCOUNTER — Encounter: Payer: Self-pay | Admitting: Nurse Practitioner

## 2017-05-22 ENCOUNTER — Ambulatory Visit: Payer: BC Managed Care – PPO | Admitting: Nurse Practitioner

## 2017-05-22 VITALS — BP 108/72 | Ht 65.0 in | Wt 129.0 lb

## 2017-05-22 DIAGNOSIS — B9689 Other specified bacterial agents as the cause of diseases classified elsewhere: Secondary | ICD-10-CM

## 2017-05-22 DIAGNOSIS — J069 Acute upper respiratory infection, unspecified: Secondary | ICD-10-CM

## 2017-05-22 MED ORDER — AZITHROMYCIN 250 MG PO TABS: ORAL_TABLET | ORAL | 0 refills | Status: DC

## 2017-05-22 NOTE — Progress Notes (Signed)
   Subjective:    Patient ID: Amber Richard, female    DOB: 03/24/1985, 32 y.o.   MRN: [id number]  HPI Patient presents today with congestion since Saturday that has progressed to a productive cough with dark yellow sputum and hoarse voice. Patient denies fevers or chills. Patient reports mild body aches and sinus pressure headaches. She has been taking Mucinex DM at night and pseudophed for congestion with minimal relief. She has been taking tylenol for headaches. She reports history of seasonal allergies that has been controlled with claritin in the past but denies beginning it this season.   Past Medical History:  Diagnosis Date  . Medical history non-contributory    Past Surgical History:  Procedure Laterality Date  . CESAREAN SECTION N/A 11/16/2016   Procedure: CESAREAN SECTION;  Surgeon: Jaymes Graffillard, Naima, MD;  Location: [facility];  Service: Obstetrics;  Laterality: N/A;  . WISDOM TOOTH EXTRACTION     Family History  Problem Relation Age of Onset  . Asthma Mother   . Depression Maternal Grandmother   . Heart disease Maternal Grandfather   . Hypertension Maternal Grandfather   . Diabetes Maternal Grandfather   . Colon cancer Maternal Grandfather    Social History   Tobacco Use  . Smoking status: Never Smoker  . Smokeless tobacco: Never Used  Substance Use Topics  . Alcohol use: No  . Drug use: No   Allergies  Allergen Reactions  . Latex Rash       Review of Systems  General: Patient reports body aches and denies fever or chills.  HEENT: Pt reports nasal congestion, sore throat, and sinus pressure Cardiac: Denies chest pain, pressure, or palpitations. Pt denies syncope or dizziness Pulmonary: Denies shortness of breath or dyspnea. She reports productive cough with thick dark yellow sputum.  GI: Denies N/V/D    Objective:   Physical Exam Vitals:   05/22/17 1533  BP: 108/72   General: WDWN. Appears calm and well-groomed. Dressed appropriate for  weather.  HEENT: Ears: No erythema, exudate, or lesions. TMs: No erythema, or bulging. Mild retraction with cloudy fluid in BIL ears. Sinuses: frontal- tender, no erythema. Oropharnyx: cloudy post nasal drainage with mild irritation.  Lymph: mild cervical adenopathy, non-tender.  Cardiac: RRR. S1 S2 intact. No murmurs or gallops.  Pulmonary: No use of accessory muscles. No adventitious breath sounds on auscultation.       Assessment & Plan:   Problem List Items Addressed This Visit    None    Visit Diagnoses    Bacterial upper respiratory infection    -  Primary   Relevant Medications   azithromycin (ZITHROMAX Z-PAK) 250 MG tablet       Plan -Medications: Restart Claritin and Flonase OTC for allergies. Begin taking Azithromycin for URI -Patient teaching: Warm salt water gargle and warm fluids for throat irritation. Medication s/e reviewed.  -Follow Up: If symptoms worsen or persist. Warning signs and symptoms reviewed.

## 2017-05-22 NOTE — Patient Instructions (Signed)
Start taking Claritin and Flonase  Will send in prescription for Z-pack, azithromycin

## 2017-05-23 ENCOUNTER — Encounter: Payer: Self-pay | Admitting: Nurse Practitioner

## 2017-08-06 ENCOUNTER — Other Ambulatory Visit: Payer: Self-pay

## 2017-08-06 MED ORDER — ONDANSETRON 4 MG PO TBDP: 4.0000 mg | ORAL_TABLET | Freq: Three times a day (TID) | ORAL | 0 refills | Status: DC | PRN

## 2017-08-09 ENCOUNTER — Other Ambulatory Visit: Payer: Self-pay | Admitting: Family Medicine

## 2017-12-18 ENCOUNTER — Other Ambulatory Visit: Payer: Self-pay | Admitting: Family Medicine

## 2017-12-18 NOTE — Telephone Encounter (Signed)
One mo ok, werite needs f u visit on it

## 2018-01-01 ENCOUNTER — Other Ambulatory Visit: Payer: Self-pay | Admitting: Family Medicine

## 2018-01-04 ENCOUNTER — Telehealth: Payer: Self-pay | Admitting: *Deleted

## 2018-01-04 MED ORDER — CITALOPRAM HYDROBROMIDE 40 MG PO TABS: 40.0000 mg | ORAL_TABLET | Freq: Every morning | ORAL | 1 refills | Status: DC

## 2018-01-14 ENCOUNTER — Ambulatory Visit: Payer: BC Managed Care – PPO

## 2018-03-19 ENCOUNTER — Encounter: Payer: Self-pay | Admitting: Family Medicine

## 2018-03-19 ENCOUNTER — Ambulatory Visit: Payer: BC Managed Care – PPO | Admitting: Family Medicine

## 2018-03-19 VITALS — BP 118/76 | Temp 98.5°F | Wt 137.4 lb

## 2018-03-19 DIAGNOSIS — J310 Chronic rhinitis: Secondary | ICD-10-CM

## 2018-03-19 DIAGNOSIS — J329 Chronic sinusitis, unspecified: Secondary | ICD-10-CM

## 2018-03-19 MED ORDER — CEFDINIR 300 MG PO CAPS: 300.0000 mg | ORAL_CAPSULE | Freq: Two times a day (BID) | ORAL | 0 refills | Status: DC

## 2018-03-19 NOTE — Progress Notes (Signed)
   Subjective:    Patient ID: Amber Richard, female    DOB: 02/26/1985, 33 y.o.   MRN: [id number]  Cough  This is a new problem. The current episode started 1 to 4 weeks ago. The cough is productive of sputum (brownish-yellow mucus). Associated symptoms include headaches and rhinorrhea. Associated symptoms comments: Feeling tired. Treatments tried: Mucinex, cough drops. The treatment provided mild relief.   Pt had cough and rnny nose  Cough dry   diinshed energy    musuus bow and yellow   Cough ad and real bad at night    Takes the mucinex at night   Notes trouble coughing and it feel s bad   Sone frontal headahe    Review of Systems  HENT: Positive for rhinorrhea.   Respiratory: Positive for cough.   Neurological: Positive for headaches.       Objective:   Physical Exam  Alert, mild malaise. Hydration good Vitals stable. frontal/ maxillary tenderness evident positive nasal congestion. pharynx normal neck supple  lungs clear/no crackles or wheezes. heart regular in rhythm       Assessment & Plan:  Impression rhinosinusitis likely post viral, discussed with patient. plan antibiotics prescribed. Questions answered. Symptomatic care discussed. warning signs discussed. WSL

## 2018-03-19 NOTE — Patient Instructions (Signed)
Cough from the chest mucinex dm   Cough from the sinuses dimetapp or actifed

## 2018-03-31 ENCOUNTER — Telehealth: Payer: Self-pay | Admitting: Family Medicine

## 2018-03-31 NOTE — Telephone Encounter (Signed)
Omeprazole otc 20 mg this eve 20 mg in the morning

## 2018-03-31 NOTE — Telephone Encounter (Signed)
Patient is experiencing gas pain in her chest (RFM NURSE aware) patient states pain has been there since Sunday 03/28/18, patient states since Sunday she has taken 6 Tums daily. Advise.   Pt has an appt with Lillia Abed tomorrow 04/01/18 @ 4.   Pharmacy:  [id number] - [place], [place] - [address] AT [facility]

## 2018-03-31 NOTE — Telephone Encounter (Signed)
Patient advised Omeprazole otc 20 mg this evening and 20 mg in the morning. Patient verbalized understanding.

## 2018-03-31 NOTE — Telephone Encounter (Signed)
Patient reports it is a burning pain and gas pain in epigastric area since Sunday -tried tums and they haven't really helped is there anything else you advise- patient has an appt tomorrow at 4 pm

## 2018-04-01 ENCOUNTER — Other Ambulatory Visit (HOSPITAL_COMMUNITY)
Admission: RE | Admit: 2018-04-01 | Discharge: 2018-04-01 | Disposition: A | Discharge status: Home / Self Care | Payer: BC Managed Care – PPO | Source: Ambulatory Visit | Attending: Family Medicine | Admitting: Family Medicine

## 2018-04-01 ENCOUNTER — Ambulatory Visit: Payer: BC Managed Care – PPO | Admitting: Family Medicine

## 2018-04-01 ENCOUNTER — Encounter: Payer: Self-pay | Admitting: Family Medicine

## 2018-04-01 VITALS — BP 98/60 | Temp 98.5°F | Ht 65.0 in | Wt 135.0 lb

## 2018-04-01 DIAGNOSIS — R1013 Epigastric pain: Secondary | ICD-10-CM | POA: Diagnosis not present

## 2018-04-01 DIAGNOSIS — R1011 Right upper quadrant pain: Secondary | ICD-10-CM | POA: Insufficient documentation

## 2018-04-01 LAB — HEPATIC FUNCTION PANEL
ALT: 16 U/L (ref 0–44)
AST: 22 U/L (ref 15–41)
Albumin: 4.5 g/dL (ref 3.5–5.0)
Alkaline Phosphatase: 47 U/L (ref 38–126)
Bilirubin, Direct: 0.1 mg/dL (ref 0.0–0.2)
Indirect Bilirubin: 0.6 mg/dL (ref 0.3–0.9)
Total Bilirubin: 0.7 mg/dL (ref 0.3–1.2)
Total Protein: 7.2 g/dL (ref 6.5–8.1)

## 2018-04-01 LAB — CBC WITH DIFFERENTIAL/PLATELET
Abs Immature Granulocytes: 0 10*3/uL (ref 0.00–0.07)
Basophils Absolute: 0 10*3/uL (ref 0.0–0.1)
Basophils Relative: 0 %
Eosinophils Absolute: 0.2 10*3/uL (ref 0.0–0.5)
Eosinophils Relative: 5 %
HCT: 42.3 % (ref 36.0–46.0)
Hemoglobin: 13.3 g/dL (ref 12.0–15.0)
Immature Granulocytes: 0 %
Lymphocytes Relative: 37 %
Lymphs Abs: 1.7 10*3/uL (ref 0.7–4.0)
MCH: 27.6 pg (ref 26.0–34.0)
MCHC: 31.4 g/dL (ref 30.0–36.0)
MCV: 87.8 fL (ref 80.0–100.0)
Monocytes Absolute: 0.3 10*3/uL (ref 0.1–1.0)
Monocytes Relative: 6 %
Neutro Abs: 2.4 10*3/uL (ref 1.7–7.7)
Neutrophils Relative %: 52 %
Platelets: 151 10*3/uL (ref 150–400)
RBC: 4.82 MIL/uL (ref 3.87–5.11)
RDW: 12.2 % (ref 11.5–15.5)
WBC: 4.7 10*3/uL (ref 4.0–10.5)
nRBC: 0 % (ref 0.0–0.2)

## 2018-04-01 LAB — BASIC METABOLIC PANEL
ANION GAP: 6 (ref 5–15)
BUN: 16 mg/dL (ref 6–20)
CO2: 29 mmol/L (ref 22–32)
Calcium: 9.1 mg/dL (ref 8.9–10.3)
Chloride: 104 mmol/L (ref 98–111)
Creatinine, Ser: 0.73 mg/dL (ref 0.44–1.00)
GFR calc Af Amer: >60 mL/min (ref >60)
GFR calc non Af Amer: >60 mL/min (ref >60)
Glucose, Bld: 84 mg/dL (ref 70–99)
Potassium: 3.8 mmol/L (ref 3.5–5.1)
Sodium: 139 mmol/L (ref 135–145)

## 2018-04-01 LAB — LIPASE, BLOOD: Lipase: 33 U/L (ref 11–51)

## 2018-04-01 MED ORDER — SUCRALFATE 1 GM/10ML PO SUSP: 1.0000 g | Freq: Three times a day (TID) | ORAL | 0 refills | Status: DC

## 2018-04-01 NOTE — Progress Notes (Signed)
   Subjective:    Patient ID: Amber Richard, female    DOB: 30-Mar-1985, 33 y.o.   MRN: [id number]  Abdominal Pain  This is a new problem. Episode onset: 4 days. The pain is located in the epigastric region. Pertinent negatives include no constipation, diarrhea, fever, nausea or vomiting. Treatments tried: omeprazole, tums. The treatment provided mild relief.   Epigastric pain and into her back x 4-5 days. Tums has not helped. Started omeprazole last night. States has improved some.   Pain is intermittent, stabbing pain. Burped more than usual. Doesn't seem to be aggravated by anything.   1 cup of coffee per day - feels like it makes it worse.   Denies any significant hx of heartburn.   No N/V/D. No fever.   Review of Systems  Constitutional: Negative for fever.  Gastrointestinal: Positive for abdominal pain. Negative for blood in stool, constipation, diarrhea, nausea and vomiting.       Objective:   Physical Exam Vitals signs and nursing note reviewed.  Constitutional:      General: She is not in acute distress.    Appearance: She is well-developed. She is not toxic-appearing.  HENT:     Head: Normocephalic and atraumatic.  Cardiovascular:     Rate and Rhythm: Normal rate and regular rhythm.     Heart sounds: Normal heart sounds.  Pulmonary:     Effort: Pulmonary effort is normal. No respiratory distress.     Breath sounds: Normal breath sounds.  Abdominal:     General: Abdomen is flat. Bowel sounds are normal. There is no distension.     Palpations: Abdomen is soft. There is no mass.     Tenderness: There is abdominal tenderness in the right upper quadrant and epigastric area. There is no guarding.  Skin:    General: Skin is warm and dry.  Neurological:     Mental Status: She is alert and oriented to person, place, and time.  Psychiatric:        Mood and Affect: Mood normal.           Assessment & Plan:  Epigastric pain  Right upper quadrant abdominal  pain  Patient reports pain in the epigastric area.  Do not feel we need acute imaging at this time.  Will obtain some stat labs to rule out infectious process or problems with the liver pancreas.  Will hold off on ultrasound for right now.  Recommend continuing omeprazole twice daily.  We will also add Carafate 10 mL's with meals and at bedtime as needed.  We will follow-up based on her lab results.  Discussed with patient could take a weeks for her symptoms to improve if this is gastritis.  Warning signs discussed.  Follow-up sooner if symptoms worsen or fail to improve.  Dr. Lilyan Punt was consulted on this case and is in agreement with the above treatment plan.

## 2018-09-13 ENCOUNTER — Other Ambulatory Visit: Payer: Self-pay | Admitting: Family Medicine

## 2018-09-13 NOTE — Telephone Encounter (Signed)
Ok times one rec virt o v to f u

## 2018-10-12 ENCOUNTER — Other Ambulatory Visit: Payer: Self-pay | Admitting: Family Medicine

## 2019-03-27 IMAGING — CT CT ABD-PELV W/ CM
2 of 4 series · 16 of 46 positions shown, 18 images · IV contrast (iopamidol)
Comparison: 03/27/2011

CLINICAL DATA: Abdominal pain and nausea for 5 months.

EXAM:
CT ABDOMEN AND PELVIS WITH CONTRAST
TECHNIQUE: Multidetector CT imaging of the abdomen and pelvis was performed
using the standard protocol following bolus administration of
intravenous contrast.
CONTRAST:  100mL KWLSPJ-W66 IOPAMIDOL (KWLSPJ-W66) INJECTION 61%

[Series 3: a/p w/ 5mm · axial · 0.83mm/px · z∈[+813,+1238]mm · 13 of 93 slices shown, 15 images]
[im 4/93  soft-tissue]
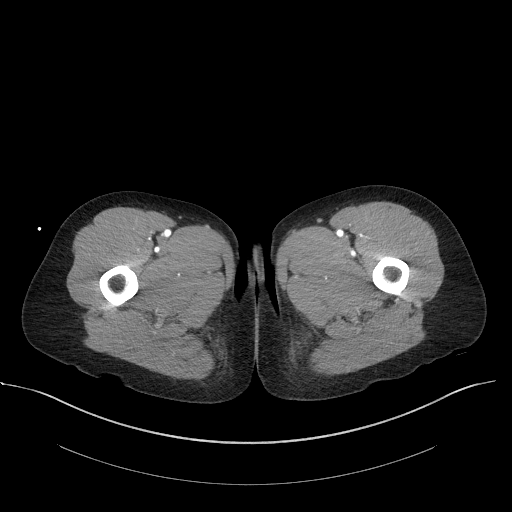
[im 4/93  bone]
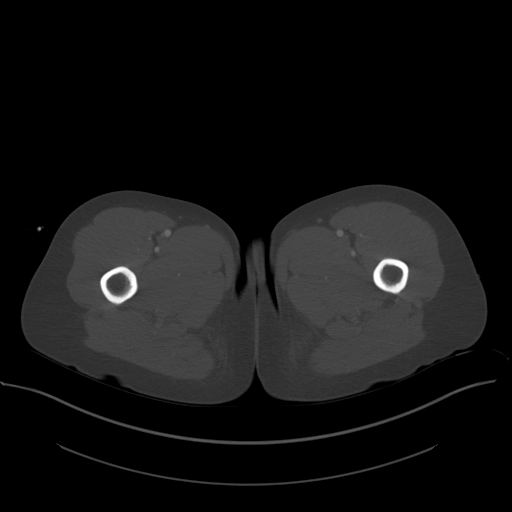
[im 12/93  soft-tissue]
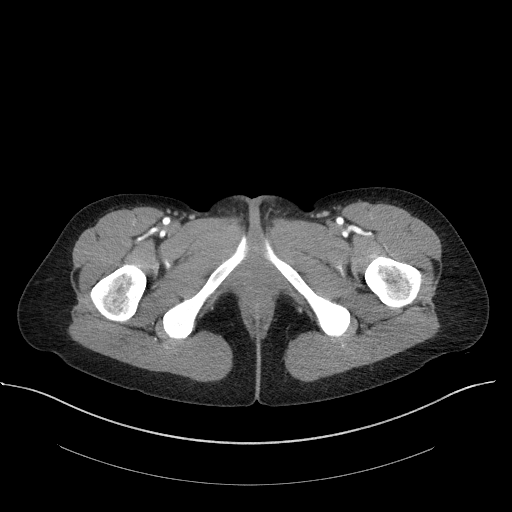
[im 19/93  soft-tissue]
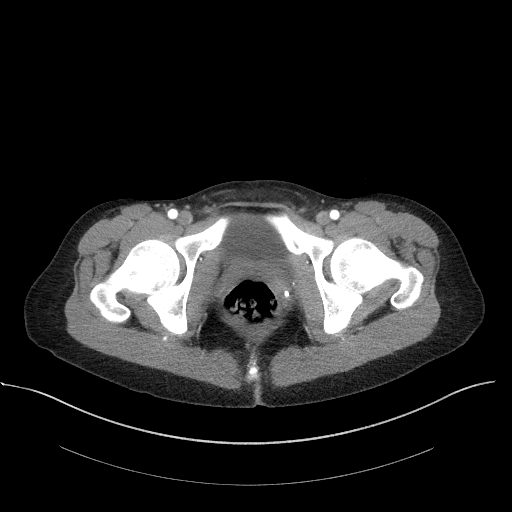
[im 26/93  soft-tissue]
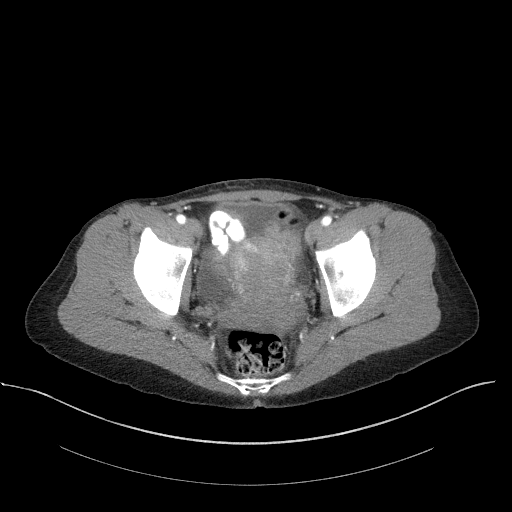
[im 34/93  soft-tissue]
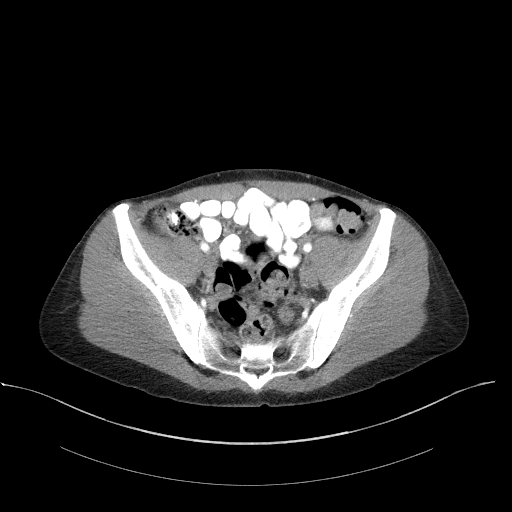
[im 41/93  soft-tissue]
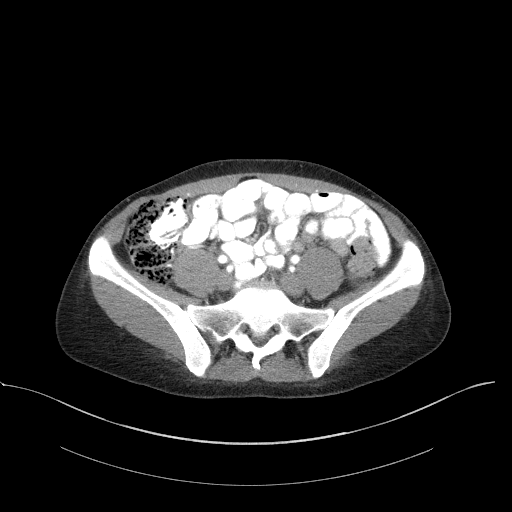
[im 48/93  soft-tissue]
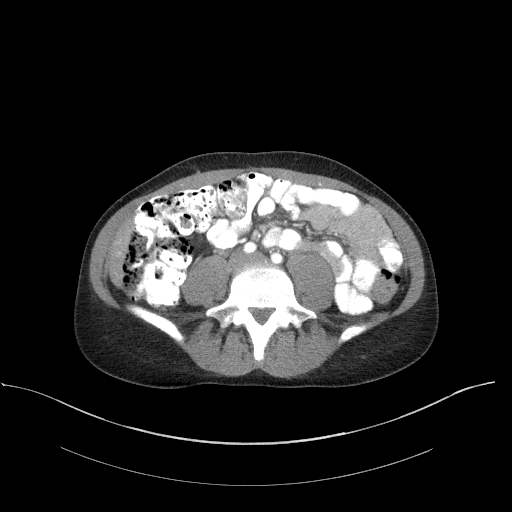
[im 52/93  soft-tissue]
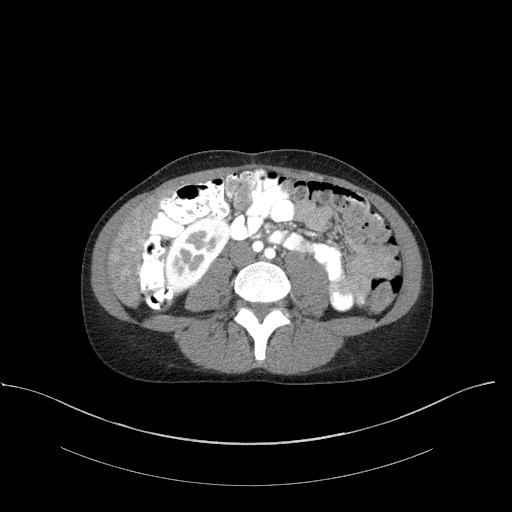
[im 59/93  soft-tissue]
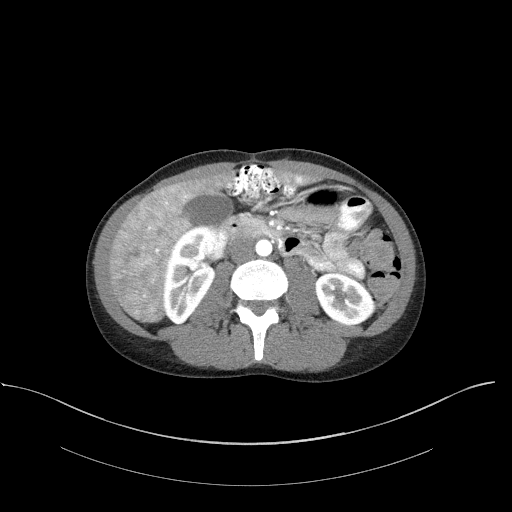
[im 59/93  bone]
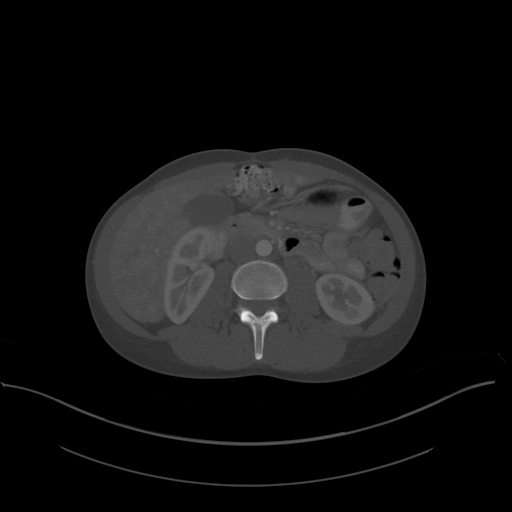
[im 67/93  soft-tissue]
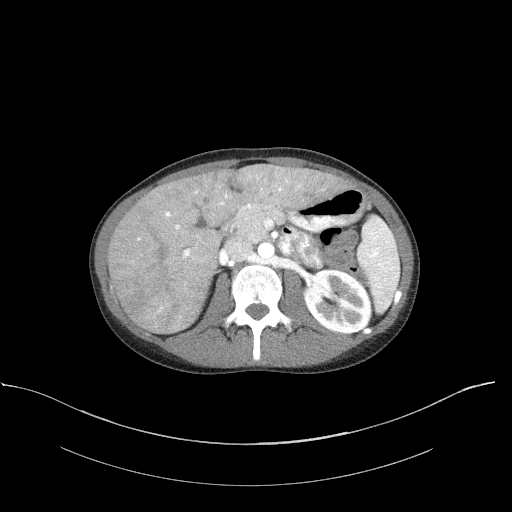
[im 74/93  soft-tissue]
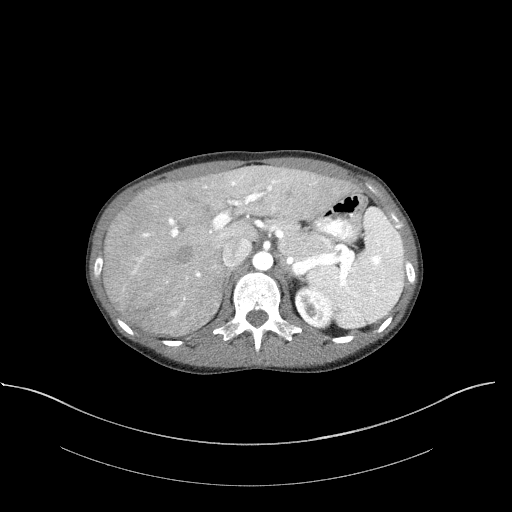
[im 81/93  soft-tissue]
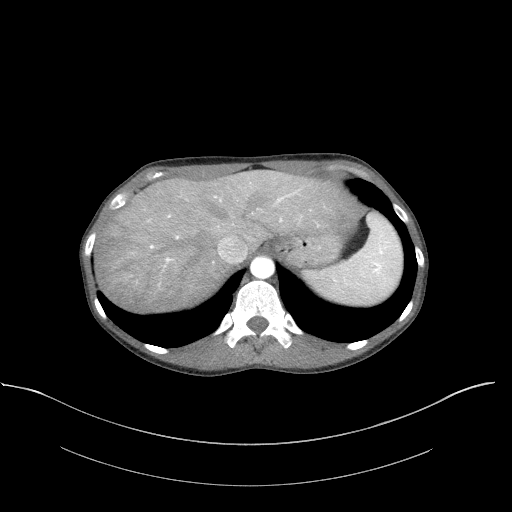
[im 89/93  soft-tissue]
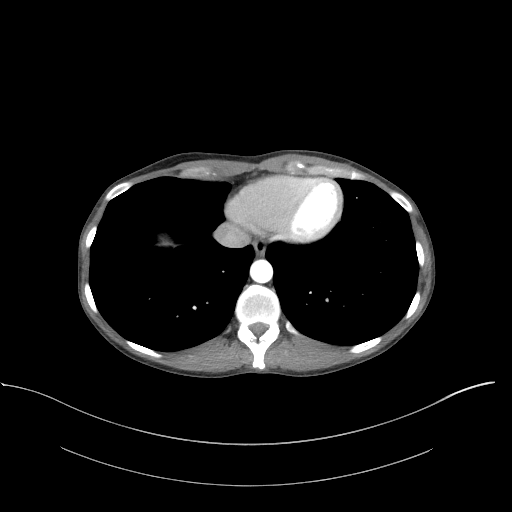

[Series 5: a/p w/ cor · coronal · 0.68mm/px · 3 of 121 slices shown]
[im 41/121  soft-tissue]
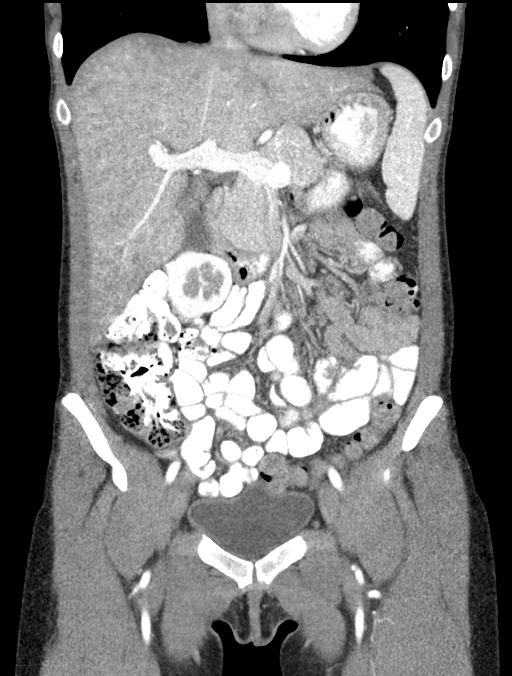
[im 54/121  soft-tissue]
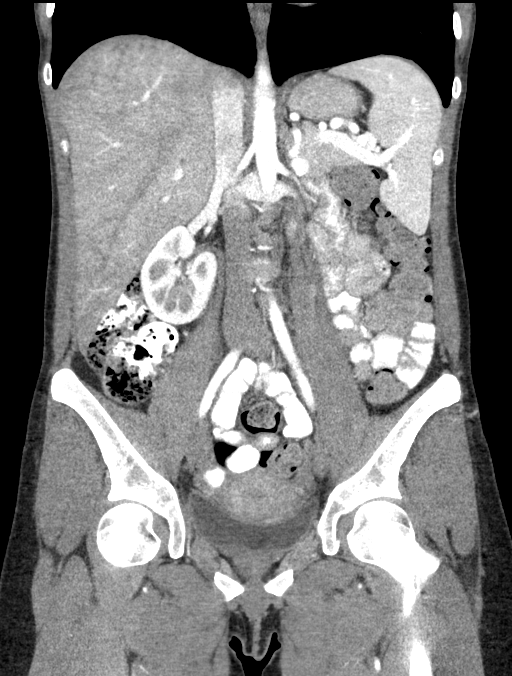
[im 67/121  soft-tissue]
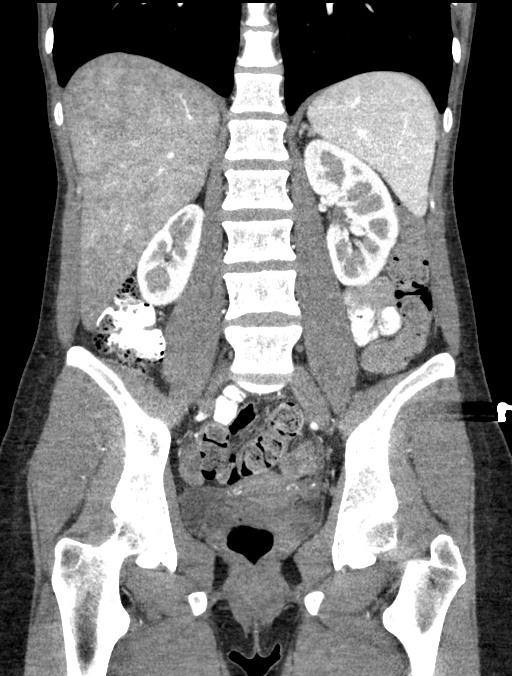

[16 of 46 positions shown; findings below may reference images not displayed]

FINDINGS: Lower Chest: No acute findings.

Hepatobiliary: No hepatic masses identified. Gallbladder is
unremarkable.

Pancreas:  No mass or inflammatory changes.

Spleen: Within normal limits in size and appearance.

Adrenals/Urinary Tract: No masses identified. No evidence of
hydronephrosis.

Stomach/Bowel: No evidence of obstruction, inflammatory process or
abnormal fluid collections. Normal appendix visualized. Large amount
of stool seen throughout colon.

Vascular/Lymphatic: No pathologically enlarged lymph nodes. No
abdominal aortic aneurysm.

Reproductive:  No mass or other significant abnormality.

Other:  None.

Musculoskeletal:  No suspicious bone lesions identified.
IMPRESSION: No acute findings.

Large stool burden noted; recommend clinical correlation for
possible constipation.

## 2019-03-28 ENCOUNTER — Encounter: Payer: Self-pay | Admitting: Family Medicine

## 2019-04-10 ENCOUNTER — Other Ambulatory Visit: Payer: Self-pay | Admitting: Family Medicine

## 2019-04-11 NOTE — Telephone Encounter (Signed)
Please schedule and then route back to nurses 

## 2019-04-11 NOTE — Telephone Encounter (Signed)
90 d ok, rec ck up next mo for re eval

## 2019-04-11 NOTE — Telephone Encounter (Signed)
Left message

## 2019-04-18 NOTE — Telephone Encounter (Signed)
lvm to schedule virtual visit  

## 2019-04-21 NOTE — Telephone Encounter (Signed)
Scheduled 3/25

## 2019-05-18 ENCOUNTER — Other Ambulatory Visit: Payer: Self-pay

## 2019-05-18 ENCOUNTER — Ambulatory Visit (INDEPENDENT_AMBULATORY_CARE_PROVIDER_SITE_OTHER): Payer: BC Managed Care – PPO | Admitting: Family Medicine

## 2019-05-18 DIAGNOSIS — F411 Generalized anxiety disorder: Secondary | ICD-10-CM | POA: Diagnosis not present

## 2019-05-18 DIAGNOSIS — F339 Major depressive disorder, recurrent, unspecified: Secondary | ICD-10-CM

## 2019-05-18 MED ORDER — CITALOPRAM HYDROBROMIDE 40 MG PO TABS: ORAL_TABLET | ORAL | 1 refills | Status: DC

## 2019-05-18 NOTE — Progress Notes (Signed)
   Subjective:  Audio only  Patient ID: Amber Richard, female    DOB: 1985/05/07, 34 y.o.   MRN: [id number]  HPI Patient calls today for a medication check up. Patient states she feels like she is doing much better on med as far as depression and energy. No other concerns.    Virtual Visit via Video Note  I connected with Amber Richard on 05/18/19 at  3:00 PM EDT by a video enabled telemedicine application and verified that I am speaking with the correct person using two identifiers.  Location: Patient: home Provider: office   I discussed the limitations of evaluation and management by telemedicine and the availability of in person appointments. The patient expressed understanding and agreed to proceed.  History of Present Illness:    Observations/Objective:   Assessment and Plan:   Follow Up Instructions:    I discussed the assessment and treatment plan with the patient. The patient was provided an opportunity to ask questions and all were answered. The patient agreed with the plan and demonstrated an understanding of the instructions.   The patient was advised to call back or seek an in-person evaluation if the symptoms worsen or if the condition fails to improve as anticipated.  I provided 21 minutes of non-face-to-face time during this encounter.  Patient notes overall her depression and anxiety has improved considerably.  Patient states also that the medication is definitely helping.  No obvious side effects.  Overall patient stress level has improved with improving situation at the school and with the pandemic.  Feels that she benefits from medication ongoing  Exercising regularly in fact still runs   Review of Systems No headache no chest pain no shortness of breath    Objective:   Physical Exam  Virtual      Assessment & Plan:  Impression depression with element of generalized anxiety disorder.  Tolerates medications well definitely benefits.   Will maintain.  6 months worth written.  Exercise encouraged.  Compliance discussed follow-up every 6 months

## 2019-05-19 ENCOUNTER — Ambulatory Visit: Payer: BC Managed Care – PPO | Admitting: Family Medicine

## 2019-06-06 ENCOUNTER — Ambulatory Visit: Payer: BC Managed Care – PPO | Attending: Internal Medicine

## 2019-06-06 ENCOUNTER — Ambulatory Visit (INDEPENDENT_AMBULATORY_CARE_PROVIDER_SITE_OTHER): Payer: BC Managed Care – PPO | Admitting: Family Medicine

## 2019-06-06 ENCOUNTER — Other Ambulatory Visit: Payer: Self-pay

## 2019-06-06 DIAGNOSIS — Z20822 Contact with and (suspected) exposure to covid-19: Secondary | ICD-10-CM

## 2019-06-06 MED ORDER — ONDANSETRON 4 MG PO TBDP: ORAL_TABLET | ORAL | 0 refills | Status: DC

## 2019-06-06 NOTE — Progress Notes (Signed)
   Subjective:  Audio only  Patient ID: Amber Richard, female    DOB: May 01, 1985, 34 y.o.   MRN: [id number]  Headache  This is a new problem. Episode onset: Sunday morning. Associated symptoms comments: Fatigue, low grade fever (99.8), chills, body aches Bilateral leg pain- could not hardly walk this morning. She has tried NSAIDs for the symptoms. The treatment provided mild relief.   Pt has appt at 10:45 am for COVID testing.   Virtual Visit via Video Note  I connected with Amber Richard on 06/06/19 at  9:30 AM EDT by a video enabled telemedicine application and verified that I am speaking with the correct person using two identifiers.  Location: Patient: home Provider: office   I discussed the limitations of evaluation and management by telemedicine and the availability of in person appointments. The patient expressed understanding and agreed to proceed.  History of Present Illness:    Observations/Objective:   Assessment and Plan:   Follow Up Instructions:    I discussed the assessment and treatment plan with the patient. The patient was provided an opportunity to ask questions and all were answered. The patient agreed with the plan and demonstrated an understanding of the instructions.   The patient was advised to call back or seek an in-person evaluation if the symptoms worsen or if the condition fails to improve as anticipated.  I provided 20 minutes of non-face-to-face time during this encounter.       Review of Systems  Neurological: Positive for headaches.       Objective:   Physical Exam  Virtual      Assessment & Plan:  Impression rather sudden onset of fatigue/fevers/achiness/headache/leg ache/nausea.  Unfortunately her mother also has similar symptoms and is elderly.  Patient went to the beach this past week.  Also ate out.  Has not had vaccines nor anyone else within her extended family unit.  I think there is a high likelihood of Covid  here.  Discussed.  Symptom care discussed.  Testing strongly encouraged patient to get.  Warning signs discussed

## 2019-06-07 LAB — NOVEL CORONAVIRUS, NAA: SARS-CoV-2, NAA: DETECTED — AB

## 2019-06-07 LAB — SARS-COV-2, NAA 2 DAY TAT

## 2019-06-08 ENCOUNTER — Telehealth: Payer: Self-pay | Admitting: Family Medicine

## 2019-06-08 NOTE — Telephone Encounter (Signed)
Telephone call- voicemail full ?

## 2019-06-08 NOTE — Telephone Encounter (Signed)
Yes, be sure to follow all the recommendations they gave. I hope her mom is doing ok

## 2019-06-08 NOTE — Telephone Encounter (Signed)
Telephone call- mailbox is full 

## 2019-06-08 NOTE — Telephone Encounter (Signed)
FYI: Pt tested positive for COVID. Pt was contacted by [facility].

## 2019-06-09 ENCOUNTER — Telehealth: Payer: Self-pay | Admitting: Internal Medicine

## 2019-06-09 NOTE — Telephone Encounter (Signed)
Pt called infusion clinic to discuss Covid symptoms and the use of monoclonal antibody infusion for those with mild to moderate Covid symptoms and at a high risk of hospitalization. Her mother received infusion yesterday and she wanted to determine if she was a candidate.     Pt is not qualified for this infusion due to lack of identified risk factors and co-morbid conditions.  Symptoms reviewed as well as criteria for ending isolation.  Symptoms reviewed that would warrant ED/Hospital evaluation as well should her condition worsen. Preventative practices reviewed. Patient verbalized understanding.   Patient Active Problem List   Diagnosis Date Noted  . Depression, recurrent (HCC) 04/09/2017  . Generalized anxiety disorder 04/09/2017  . Normal labor 11/15/2016   Cyndee Brightly, NP-C [facility]

## 2019-06-09 NOTE — Telephone Encounter (Signed)
My Chart message sent

## 2019-06-09 NOTE — Telephone Encounter (Signed)
Pt returned call and verbalized understanding. Pt states she had heard about monoclonial antibody infusion and was wanting to know if that was something that was recommended. Gave patient to number we have to reach the infusion site. Pt verbalized understanding.

## 2019-08-01 ENCOUNTER — Encounter: Payer: Self-pay | Admitting: Family Medicine

## 2019-08-01 ENCOUNTER — Other Ambulatory Visit: Payer: Self-pay

## 2019-08-01 ENCOUNTER — Ambulatory Visit: Payer: BC Managed Care – PPO | Admitting: Family Medicine

## 2019-08-01 VITALS — BP 98/66 | HR 92 | Temp 98.3°F | Ht 65.0 in | Wt 129.0 lb

## 2019-08-01 DIAGNOSIS — Z01818 Encounter for other preprocedural examination: Secondary | ICD-10-CM

## 2019-08-01 NOTE — Progress Notes (Signed)
 Patient ID: Amber Richard, female    DOB: 01/20/1986, 34 y.o.   MRN: [id number]   Chief Complaint  Patient presents with  . Pre-op Exam   Subjective:    HPI   Pt seen for pre-op exam.  pt wanting to have labs done for cosmetic surgery. Pt is going to have breast augmentation. Needing labs prior to appt.   Surgery is scheduled for 08/22/19. With Dr. Laws in [place], [place]. Pt feeling well, no URI.  No h/o problems with anesthesia in past.  Medical History Burlene has a past medical history of Medical history non-contributory.   Outpatient Encounter Medications as of 08/01/2019  Medication Sig  . acetaminophen (TYLENOL) 500 MG tablet Take 500 mg by mouth every 6 (six) hours as needed.  . calcium carbonate (TUMS - DOSED IN MG ELEMENTAL CALCIUM) 500 MG chewable tablet Chew 1,000 mg by mouth daily as needed for indigestion or heartburn.  . citalopram (CELEXA) 40 MG tablet TAKE 1 TABLET BY MOUTH EVERY DAY IN THE MORNING  . ibuprofen (ADVIL,MOTRIN) 100 MG/5ML suspension Take 30 mLs (600 mg total) by mouth every 6 (six) hours as needed for mild pain.  . nitrofurantoin, macrocrystal-monohydrate, (MACROBID) 100 MG capsule Take 100 mg by mouth 2 (two) times daily.  . ondansetron (ZOFRAN ODT) 4 MG disintegrating tablet Place one tablet sublingual q 8 hrs prn nausea  . OVER THE COUNTER MEDICATION Vitamin C, Vitamin D, Boron and Folic acid  . polyethylene glycol (MIRALAX / GLYCOLAX) packet Take 17 g by mouth daily as needed for mild constipation.  . Probiotic Product (PROBIOTIC DAILY PO) Take by mouth.   No facility-administered encounter medications on file as of 08/01/2019.     Review of Systems  Constitutional: Negative for chills and fever.  HENT: Negative for congestion, rhinorrhea and sore throat.   Respiratory: Negative for cough, shortness of breath and wheezing.   Cardiovascular: Negative for chest pain and leg swelling.  Gastrointestinal: Negative for abdominal pain, diarrhea,  nausea and vomiting.  Genitourinary: Negative for dysuria and frequency.  Musculoskeletal: Negative for arthralgias and back pain.  Skin: Negative for rash.  Neurological: Negative for dizziness, weakness and headaches.     Vitals BP 98/66   Pulse 92   Temp 98.3 F (36.8 C)   Ht 5' 5" (1.651 m)   Wt 129 lb (58.5 kg)   SpO2 94%   BMI 21.47 kg/m   Objective:   Physical Exam Vitals and nursing note reviewed.  Constitutional:      General: She is not in acute distress.    Appearance: Normal appearance. She is not ill-appearing.  HENT:     Head: Normocephalic and atraumatic.     Right Ear: External ear normal.     Left Ear: External ear normal.     Nose: Nose normal. No congestion or rhinorrhea.     Mouth/Throat:     Mouth: Mucous membranes are moist.     Pharynx: Oropharynx is clear. No oropharyngeal exudate or posterior oropharyngeal erythema.  Eyes:     Extraocular Movements: Extraocular movements intact.     Conjunctiva/sclera: Conjunctivae normal.     Pupils: Pupils are equal, round, and reactive to light.  Cardiovascular:     Rate and Rhythm: Normal rate and regular rhythm.     Pulses: Normal pulses.     Heart sounds: Normal heart sounds. No murmur heard.   Pulmonary:     Effort: Pulmonary effort is normal. No respiratory distress.       Breath sounds: Normal breath sounds. No wheezing, rhonchi or rales.  Abdominal:     General: Abdomen is flat. Bowel sounds are normal. There is no distension.     Palpations: Abdomen is soft. There is no mass.     Tenderness: There is no abdominal tenderness. There is no guarding or rebound.     Hernia: No hernia is present.  Musculoskeletal:        General: Normal range of motion.     Right lower leg: No edema.     Left lower leg: No edema.  Skin:    General: Skin is warm and dry.     Findings: No lesion or rash.  Neurological:     General: No focal deficit present.     Mental Status: She is alert and oriented to person,  place, and time.     Cranial Nerves: No cranial nerve deficit.  Psychiatric:        Mood and Affect: Mood normal.        Behavior: Behavior normal.      Assessment and Plan   1. Pre-op evaluation - CBC - CMP14+EGFR - INR/PT - PTT - Urinalysis - Pregnancy, urine    Pending UA and urine pregnancy testing. All other labs normal.  Pt is low risk for breast augmentation surgery.  Pt is medically cleared for surgery.  F/u prn.

## 2019-08-02 LAB — URINALYSIS
Bilirubin, UA: NEGATIVE
Glucose, UA: NEGATIVE
Ketones, UA: NEGATIVE
Leukocytes,UA: NEGATIVE
Nitrite, UA: NEGATIVE
Protein,UA: NEGATIVE
RBC, UA: NEGATIVE
Specific Gravity, UA: 1.008 (ref 1.005–1.030)
Urobilinogen, Ur: 0.2 mg/dL (ref 0.2–1.0)
pH, UA: 7.5 (ref 5.0–7.5)

## 2019-08-02 LAB — CBC
Hematocrit: 41.4 % (ref 34.0–46.6)
Hemoglobin: 13.4 g/dL (ref 11.1–15.9)
MCH: 28 pg (ref 26.6–33.0)
MCHC: 32.4 g/dL (ref 31.5–35.7)
MCV: 87 fL (ref 79–97)
Platelets: 148 10*3/uL — ABNORMAL LOW (ref 150–450)
RBC: 4.78 x10E6/uL (ref 3.77–5.28)
RDW: 12.8 % (ref 11.7–15.4)
WBC: 5.5 10*3/uL (ref 3.4–10.8)

## 2019-08-02 LAB — CMP14+EGFR
ALT: 13 IU/L (ref 0–32)
AST: 22 IU/L (ref 0–40)
Albumin/Globulin Ratio: 2.1 (ref 1.2–2.2)
Albumin: 4.8 g/dL (ref 3.8–4.8)
Alkaline Phosphatase: 56 IU/L (ref 48–121)
BUN/Creatinine Ratio: 22 (ref 9–23)
BUN: 14 mg/dL (ref 6–20)
Bilirubin Total: 0.5 mg/dL (ref 0.0–1.2)
CO2: 27 mmol/L (ref 20–29)
Calcium: 9.3 mg/dL (ref 8.7–10.2)
Chloride: 101 mmol/L (ref 96–106)
Creatinine, Ser: 0.63 mg/dL (ref 0.57–1.00)
GFR calc Af Amer: 137 mL/min/{1.73_m2} (ref >59)
GFR calc non Af Amer: 119 mL/min/{1.73_m2} (ref >59)
Globulin, Total: 2.3 g/dL (ref 1.5–4.5)
Glucose: 84 mg/dL (ref 65–99)
Potassium: 4.5 mmol/L (ref 3.5–5.2)
Sodium: 141 mmol/L (ref 134–144)
Total Protein: 7.1 g/dL (ref 6.0–8.5)

## 2019-08-02 LAB — PROTIME-INR
INR: 1 (ref 0.9–1.2)
Prothrombin Time: 10.8 s (ref 9.1–12.0)

## 2019-08-02 LAB — APTT: aPTT: 27 s (ref 24–33)

## 2019-08-15 ENCOUNTER — Ambulatory Visit: Payer: BC Managed Care – PPO | Attending: Internal Medicine

## 2019-08-15 DIAGNOSIS — Z20822 Contact with and (suspected) exposure to covid-19: Secondary | ICD-10-CM

## 2019-08-16 LAB — SARS-COV-2, NAA 2 DAY TAT

## 2019-08-16 LAB — NOVEL CORONAVIRUS, NAA: SARS-CoV-2, NAA: NOT DETECTED

## 2019-10-12 ENCOUNTER — Other Ambulatory Visit: Payer: Self-pay | Admitting: *Deleted

## 2019-10-12 ENCOUNTER — Telehealth: Payer: Self-pay | Admitting: Family Medicine

## 2019-10-12 DIAGNOSIS — Z1152 Encounter for screening for COVID-19: Secondary | ICD-10-CM

## 2019-10-12 NOTE — Telephone Encounter (Signed)
Pt can have labs drawn, not a problem if she thinks she might be pregnant.  But needs to call [facility] to see cost, bc could be expensive to get the testing.  Nurses-Pls put order in for covid ab testing.   Thx.   Dr. Ladona Ridgel

## 2019-10-12 NOTE — Telephone Encounter (Signed)
Pt would like to have covid antibody lab work but would like to know if it is a chance she is pregnant is it safe to have that lab work done.

## 2019-10-12 NOTE — Telephone Encounter (Signed)
Lab ordered patient notified.

## 2019-11-24 ENCOUNTER — Other Ambulatory Visit: Payer: Self-pay | Admitting: *Deleted

## 2019-11-24 MED ORDER — CITALOPRAM HYDROBROMIDE 40 MG PO TABS: ORAL_TABLET | ORAL | 0 refills | Status: DC

## 2019-11-24 NOTE — Telephone Encounter (Signed)
Pt scheduled 10/14

## 2019-11-28 ENCOUNTER — Telehealth: Payer: Self-pay

## 2019-11-28 ENCOUNTER — Other Ambulatory Visit: Payer: Self-pay | Admitting: *Deleted

## 2019-11-28 MED ORDER — CITALOPRAM HYDROBROMIDE 40 MG PO TABS: ORAL_TABLET | ORAL | 0 refills | Status: DC

## 2019-11-28 NOTE — Telephone Encounter (Signed)
Pt needs refill on citalopram (CELEXA) 40 MG tablet  Pt has med check on 10/14. [id number] - [place], [place] - [address] AT [address]   Pt call back [phone]

## 2019-11-28 NOTE — Telephone Encounter (Signed)
Med sent to pharm per protocol.  Pt notified

## 2019-11-29 ENCOUNTER — Telehealth: Payer: Self-pay

## 2019-11-29 NOTE — Telephone Encounter (Signed)
Pt contacted. Pt states that pharmacist told her that she can not get the script until she met with provider. Please advise. Thank you

## 2019-11-29 NOTE — Telephone Encounter (Signed)
Pt calling checking on status of refill on citalopram. Pt states pharmacy is telling her she can not get script til 10/13

## 2019-11-30 NOTE — Telephone Encounter (Signed)
Contacted [facility] Pharmacy states that patient picked up a 90 day supply on 10/12/19. Pt contacted and states that she will look at home to see if she has another bottle and will let us know

## 2019-11-30 NOTE — Telephone Encounter (Signed)
Pls give pt medication early, not sure why pharmacy not filling it.  Is she picking it up too early?  Needs to f/u about celexa at next visit.   Thx.   Dr. Ladona Ridgel

## 2019-12-08 ENCOUNTER — Encounter: Payer: Self-pay | Admitting: Family Medicine

## 2019-12-08 ENCOUNTER — Ambulatory Visit (INDEPENDENT_AMBULATORY_CARE_PROVIDER_SITE_OTHER): Payer: BC Managed Care – PPO | Admitting: Family Medicine

## 2019-12-08 VITALS — BP 116/68 | HR 65 | Temp 98.4°F | Ht 65.0 in | Wt 132.4 lb

## 2019-12-08 DIAGNOSIS — Z87440 Personal history of urinary (tract) infections: Secondary | ICD-10-CM

## 2019-12-08 DIAGNOSIS — F339 Major depressive disorder, recurrent, unspecified: Secondary | ICD-10-CM

## 2019-12-08 DIAGNOSIS — F411 Generalized anxiety disorder: Secondary | ICD-10-CM

## 2019-12-08 MED ORDER — CITALOPRAM HYDROBROMIDE 10 MG PO TABS: ORAL_TABLET | ORAL | 0 refills | Status: DC

## 2019-12-08 NOTE — Patient Instructions (Addendum)
With your remaining 40mg  tablets, take 1/2 tab for 7 days,.  Then new dose of 10mg  tablets of celexa-start-  then the next week, take the 10mg  tablets, and take 1 tab daily for 7 days, then take 1/2 tab for 7 days, then take 1/2 tab every other day for 1 wk, then discontinue.

## 2019-12-08 NOTE — Progress Notes (Signed)
Patient ID: Amber Richard, female    DOB: Jul 09, 1985, 34 y.o.   MRN: [id number]   Chief Complaint  Patient presents with  . Depression    follow up- would like to discuss tapering med because she is considering another pregnancy   Subjective:     HPI  Cc- depression/anxiety follow up.  Wanting to discuss tapering off the medication.  Pt ws pregnant with first child and needing to taper off it. Pt chose not to take celexa during pregnancy. Thinking about getting pregnant.  Thinking about wanting to dec it. Last time stopped the medication abruptly when pregnant and wanting to do it the correct way to avoid side effects.  Started this in 2013 with 20mg  celexa dose then inc to 40mg .    Medical History Amber Richard has a past medical history of Medical history non-contributory.   Outpatient Encounter Medications as of 12/08/2019  Medication Sig  . acetaminophen (TYLENOL) 500 MG tablet Take 500 mg by mouth every 6 (six) hours as needed.  . calcium carbonate (TUMS - DOSED IN MG ELEMENTAL CALCIUM) 500 MG chewable tablet Chew 1,000 mg by mouth daily as needed for indigestion or heartburn.  . citalopram (CELEXA) 10 MG tablet Take 1 tab daily p.o. for 7 days, then take 1/2 tab for 7 days, then take 1/2 tab every other day for 1 wk, then discontinue.  Amber Richard ibuprofen (ADVIL,MOTRIN) 100 MG/5ML suspension Take 30 mLs (600 mg total) by mouth every 6 (six) hours as needed for mild pain.  . nitrofurantoin, macrocrystal-monohydrate, (MACROBID) 100 MG capsule Take 100 mg by mouth 2 (two) times daily.  . ondansetron (ZOFRAN ODT) 4 MG disintegrating tablet Place one tablet sublingual q 8 hrs prn nausea  . OVER THE COUNTER MEDICATION Vitamin C, Vitamin D, Boron and Folic acid  . polyethylene glycol (MIRALAX / GLYCOLAX) packet Take 17 g by mouth daily as needed for mild constipation.  . Probiotic Product (PROBIOTIC DAILY PO) Take by mouth.  . [DISCONTINUED] citalopram (CELEXA) 40 MG tablet TAKE 1  TABLET BY MOUTH EVERY DAY IN THE MORNING   No facility-administered encounter medications on file as of 12/08/2019.     Review of Systems  Constitutional: Negative for chills and fever.  HENT: Negative for congestion, rhinorrhea and sore throat.   Respiratory: Negative for cough, shortness of breath and wheezing.   Cardiovascular: Negative for chest pain and leg swelling.  Gastrointestinal: Negative for abdominal pain, diarrhea, nausea and vomiting.  Genitourinary: Negative for dysuria and frequency.  Musculoskeletal: Negative for arthralgias and back pain.  Skin: Negative for rash.  Neurological: Negative for dizziness, weakness and headaches.  Psychiatric/Behavioral: Negative for decreased concentration, dysphoric mood, sleep disturbance and suicidal ideas. The patient is not nervous/anxious.      Vitals BP 116/68   Pulse 65   Temp 98.4 F (36.9 C) (Oral)   Ht 5\' 5"  (1.651 m)   Wt 132 lb 6.4 oz (60.1 kg)   SpO2 100%   BMI 22.03 kg/m   Objective:   Physical Exam Vitals and nursing note reviewed.  Constitutional:      General: She is not in acute distress.    Appearance: Normal appearance. She is not ill-appearing.  HENT:     Head: Normocephalic and atraumatic.  Eyes:     Extraocular Movements: Extraocular movements intact.     Conjunctiva/sclera: Conjunctivae normal.     Pupils: Pupils are equal, round, and reactive to light.  Cardiovascular:     Rate and Rhythm: Normal  rate and regular rhythm.     Pulses: Normal pulses.     Heart sounds: Normal heart sounds. No murmur heard.   Pulmonary:     Effort: Pulmonary effort is normal. No respiratory distress.     Breath sounds: Normal breath sounds. No wheezing, rhonchi or rales.  Musculoskeletal:        General: Normal range of motion.     Right lower leg: No edema.     Left lower leg: No edema.  Skin:    General: Skin is warm and dry.     Findings: No lesion or rash.  Neurological:     General: No focal deficit  present.     Mental Status: She is alert and oriented to person, place, and time.     Cranial Nerves: No cranial nerve deficit.  Psychiatric:        Mood and Affect: Mood normal.        Behavior: Behavior normal.      Assessment and Plan   1. Depression, recurrent (HCC)  2. Generalized anxiety disorder   Depression and anxiety- stable.  Taking 40mg  celexa.   Pt wanting to work on a slow taper down off the celexa since she is starting to try to get pregnant.  She was able to discontinue it during last pregnancy, but wanting to get a slower taper this time.  Tapering instructions given below-   "With your remaining 40mg  tablets, take 1/2 tab for 7 days,. Then new dose of 10mg  tablets of celexa-start-  then the next week, take the 10mg  tablets, and take 1 tab daily for 7 days, then take 1/2 tab for 7 days, then take 1/2 tab every other day for 1 wk, then discontinue."  F/u prn.

## 2019-12-21 ENCOUNTER — Encounter: Payer: Self-pay | Admitting: Family Medicine

## 2019-12-26 ENCOUNTER — Other Ambulatory Visit: Payer: Self-pay | Admitting: Family Medicine

## 2020-04-12 LAB — OB RESULTS CONSOLE GC/CHLAMYDIA
Chlamydia: NEGATIVE
Gonorrhea: NEGATIVE

## 2020-04-12 LAB — OB RESULTS CONSOLE RPR: RPR: NONREACTIVE

## 2020-04-12 LAB — OB RESULTS CONSOLE HEPATITIS B SURFACE ANTIGEN: Hepatitis B Surface Ag: NEGATIVE

## 2020-04-12 LAB — OB RESULTS CONSOLE HIV ANTIBODY (ROUTINE TESTING): HIV: NONREACTIVE

## 2020-04-12 LAB — OB RESULTS CONSOLE ANTIBODY SCREEN: Antibody Screen: NEGATIVE

## 2020-04-12 LAB — OB RESULTS CONSOLE ABO/RH: RH Type: POSITIVE

## 2020-04-12 LAB — OB RESULTS CONSOLE RUBELLA ANTIBODY, IGM: Rubella: IMMUNE

## 2020-06-07 ENCOUNTER — Ambulatory Visit: Payer: BC Managed Care – PPO | Admitting: Pediatrics

## 2020-09-03 ENCOUNTER — Encounter: Payer: Self-pay | Admitting: Pediatrics

## 2020-11-05 ENCOUNTER — Encounter (HOSPITAL_COMMUNITY): Payer: Self-pay

## 2020-11-05 NOTE — Patient Instructions (Addendum)
Yvonna Beeghly  11/05/2020   Your procedure is scheduled on:  11/19/2020  Arrive at 0530 at Entrance C on [address] at [facility]  and [employer]. You are invited to use the FREE valet parking or use the Visitor's parking deck.  Pick up the phone at the desk and dial [phone].  Call this number if you have problems the morning of surgery: [phone]  Remember:   Do not eat food:(After Midnight) Desps de medianoche.  Do not drink clear liquids: (After Midnight) Desps de medianoche.  Take these medicines the morning of surgery with A SIP OF WATER:  none   Do not wear jewelry, make-up or nail polish.  Do not wear lotions, powders, or perfumes. Do not wear deodorant.  Do not shave 48 hours prior to surgery.  Do not bring valuables to the hospital.  [facility] is not   responsible for any belongings or valuables brought to the hospital.  Contacts, dentures or bridgework may not be worn into surgery.  Leave suitcase in the car. After surgery it may be brought to your room.  For patients admitted to the hospital, checkout time is 11:00 AM the day of              discharge.      Please read over the following fact sheets that you were given:     Preparing for Surgery

## 2020-11-14 NOTE — H&P (Signed)
Amber Richard is a 35 y.o. female G2P1001 at [redacted]w[redacted]d presenting for repeat C/S for breech presentation.  Patient has h/o anxiety and it has been stable this pregnancy; discontinued citalopram with pos upt.  GBS negative.  OB History     Gravida  2   Para  1   Term      Preterm      AB      Living         SAB      IAB      Ectopic      Multiple      Live Births             Past Medical History:  Diagnosis Date   Hx of acute pyelonephritis    Medical history non-contributory    PONV (postoperative nausea and vomiting)    Past Surgical History:  Procedure Laterality Date   BREAST ENHANCEMENT SURGERY     CESAREAN SECTION N/A 11/16/2016   Procedure: CESAREAN SECTION;  Surgeon: Jaymes Graff, MD;  Location: [facility];  Service: Obstetrics;  Laterality: N/A;   WISDOM TOOTH EXTRACTION     Family History: family history includes Asthma in her mother; Colon cancer in her maternal grandfather; Depression in her maternal grandmother; Diabetes in her maternal grandfather; Heart disease in her maternal grandfather; Hypertension in her maternal grandfather. Social History:  reports that she has never smoked. She has never used smokeless tobacco. She reports that she does not drink alcohol and does not use drugs.     Maternal Diabetes: No Genetic Screening: Normal Maternal Ultrasounds/Referrals: Normal Fetal Ultrasounds or other Referrals:  None Maternal Substance Abuse:  No Significant Maternal Medications:  None Significant Maternal Lab Results:  Group B Strep negative Other Comments:  None  Review of Systems Maternal Medical History:  Prenatal complications: no prenatal complications Prenatal Complications - Diabetes: none.    currently breastfeeding. Maternal Exam:  Abdomen: Patient reports no abdominal tenderness. Surgical scars: low transverse.   Fundal height is c/w dates.   Estimated fetal weight is 7#12.   Fetal presentation:  breech  Physical Exam Constitutional:      Appearance: Normal appearance.  HENT:     Head: Normocephalic and atraumatic.  Pulmonary:     Effort: Pulmonary effort is normal.  Abdominal:     Palpations: Abdomen is soft.  Musculoskeletal:        General: Normal range of motion.     Cervical back: Normal range of motion.  Skin:    General: Skin is warm and dry.  Neurological:     General: No focal deficit present.     Mental Status: She is alert and oriented to person, place, and time.  Psychiatric:        Mood and Affect: Mood normal.        Behavior: Behavior normal.    Prenatal labs: ABO, Rh: O/Positive/-- (02/17 0000) Antibody: Negative (02/17 0000) Rubella: Immune (02/17 0000) RPR: Nonreactive (02/17 0000)  HBsAg: Negative (02/17 0000)  HIV: Non-reactive (02/17 0000)  GBS:   Negative  Assessment/Plan: 35 yo G2P1001 at 39weeks for repeat C/S for breech presentation Patient is counseled re: risk of bleeding, infection, scarring and damage to surrounding structures.  She is informed of implications in future pregnancies including abnormal placentation and uterine rupture.  All questions were answered and patient wishes to proceed.   Mitchel Honour 11/14/2020, 2:49 PM

## 2020-11-16 ENCOUNTER — Other Ambulatory Visit: Payer: Self-pay

## 2020-11-16 ENCOUNTER — Encounter (HOSPITAL_COMMUNITY)
Admission: RE | Admit: 2020-11-16 | Discharge: 2020-11-16 | Disposition: A | Discharge status: Home / Self Care | Payer: BC Managed Care – PPO | Source: Ambulatory Visit | Attending: Obstetrics & Gynecology | Admitting: Obstetrics & Gynecology

## 2020-11-16 ENCOUNTER — Other Ambulatory Visit: Payer: Self-pay | Admitting: Obstetrics & Gynecology

## 2020-11-16 DIAGNOSIS — Z01812 Encounter for preprocedural laboratory examination: Secondary | ICD-10-CM | POA: Insufficient documentation

## 2020-11-16 DIAGNOSIS — Z20822 Contact with and (suspected) exposure to covid-19: Secondary | ICD-10-CM | POA: Insufficient documentation

## 2020-11-16 HISTORY — DX: Personal history of urinary (tract) infections: Z87.440

## 2020-11-16 HISTORY — DX: Nausea with vomiting, unspecified: R11.2

## 2020-11-16 HISTORY — DX: Other specified postprocedural states: Z98.890

## 2020-11-16 HISTORY — DX: Personal history of other diseases of urinary system: Z87.448

## 2020-11-16 LAB — CBC
HCT: 37.3 % (ref 36.0–46.0)
Hemoglobin: 12.2 g/dL (ref 12.0–15.0)
MCH: 29.1 pg (ref 26.0–34.0)
MCHC: 32.7 g/dL (ref 30.0–36.0)
MCV: 89 fL (ref 80.0–100.0)
Platelets: 157 10*3/uL (ref 150–400)
RBC: 4.19 MIL/uL (ref 3.87–5.11)
RDW: 12.8 % (ref 11.5–15.5)
WBC: 7 10*3/uL (ref 4.0–10.5)
nRBC: 0 % (ref 0.0–0.2)

## 2020-11-16 LAB — TYPE AND SCREEN
ABO/RH(D): O POS
Antibody Screen: NEGATIVE

## 2020-11-16 LAB — RPR: RPR Ser Ql: NONREACTIVE

## 2020-11-17 LAB — SARS CORONAVIRUS 2 (TAT 6-24 HRS): SARS Coronavirus 2: NEGATIVE

## 2020-11-19 ENCOUNTER — Other Ambulatory Visit: Payer: Self-pay

## 2020-11-19 ENCOUNTER — Inpatient Hospital Stay (HOSPITAL_COMMUNITY)
Admission: RE | Admit: 2020-11-19 | Discharge: 2020-11-21 | DRG: 788 | Disposition: A | Discharge status: Home / Self Care | Payer: BC Managed Care – PPO | Attending: Obstetrics & Gynecology | Admitting: Obstetrics & Gynecology

## 2020-11-19 ENCOUNTER — Encounter (HOSPITAL_COMMUNITY): Payer: Self-pay | Admitting: Obstetrics & Gynecology

## 2020-11-19 ENCOUNTER — Inpatient Hospital Stay (HOSPITAL_COMMUNITY): Payer: BC Managed Care – PPO | Admitting: Anesthesiology

## 2020-11-19 ENCOUNTER — Encounter (HOSPITAL_COMMUNITY)
Admission: RE | Disposition: A | Discharge status: Home / Self Care | Payer: Self-pay | Source: Home / Self Care | Attending: Obstetrics & Gynecology

## 2020-11-19 DIAGNOSIS — O321XX0 Maternal care for breech presentation, not applicable or unspecified: Secondary | ICD-10-CM | POA: Diagnosis present

## 2020-11-19 DIAGNOSIS — Z98891 History of uterine scar from previous surgery: Secondary | ICD-10-CM

## 2020-11-19 DIAGNOSIS — Z3A39 39 weeks gestation of pregnancy: Secondary | ICD-10-CM | POA: Diagnosis not present

## 2020-11-19 DIAGNOSIS — O34211 Maternal care for low transverse scar from previous cesarean delivery: Secondary | ICD-10-CM | POA: Diagnosis present

## 2020-11-19 SURGERY — Surgical Case
Anesthesia: Spinal

## 2020-11-19 MED ORDER — NALBUPHINE HCL 10 MG/ML IJ SOLN: 5.0000 mg | Freq: Once | INTRAMUSCULAR | Status: DC | PRN

## 2020-11-19 MED ORDER — SENNOSIDES-DOCUSATE SODIUM 8.6-50 MG PO TABS
2.0000 | ORAL_TABLET | Freq: Every day | ORAL | Status: DC
  Administered 2020-11-20 – 2020-11-21 (×2): 2 via ORAL
  Filled 2020-11-19 (×2): qty 2

## 2020-11-19 MED ORDER — LACTATED RINGERS IV SOLN: INTRAVENOUS | Status: DC

## 2020-11-19 MED ORDER — MEPERIDINE HCL 25 MG/ML IJ SOLN: 6.2500 mg | INTRAMUSCULAR | Status: DC | PRN

## 2020-11-19 MED ORDER — NALBUPHINE HCL 10 MG/ML IJ SOLN
5.0000 mg | Freq: Once | INTRAMUSCULAR | Status: DC | PRN
Start: 2020-11-19 — End: 2020-11-21

## 2020-11-19 MED ORDER — HYDROMORPHONE HCL 1 MG/ML IJ SOLN
INTRAMUSCULAR | Status: AC
  Filled 2020-11-19: qty 0.5

## 2020-11-19 MED ORDER — ONDANSETRON HCL 4 MG/2ML IJ SOLN: 4.0000 mg | Freq: Three times a day (TID) | INTRAMUSCULAR | Status: DC | PRN

## 2020-11-19 MED ORDER — DIBUCAINE (PERIANAL) 1 % EX OINT: 1.0000 "application " | TOPICAL_OINTMENT | CUTANEOUS | Status: DC | PRN

## 2020-11-19 MED ORDER — FENTANYL CITRATE (PF) 100 MCG/2ML IJ SOLN
INTRAMUSCULAR | Status: DC | PRN
  Administered 2020-11-19: 85 ug via INTRAVENOUS
  Administered 2020-11-19: 100 ug via INTRAVENOUS

## 2020-11-19 MED ORDER — OXYTOCIN-SODIUM CHLORIDE 30-0.9 UT/500ML-% IV SOLN
INTRAVENOUS | Status: AC
  Filled 2020-11-19: qty 500

## 2020-11-19 MED ORDER — MENTHOL 3 MG MT LOZG: 1.0000 | LOZENGE | OROMUCOSAL | Status: DC | PRN

## 2020-11-19 MED ORDER — OXYTOCIN-SODIUM CHLORIDE 30-0.9 UT/500ML-% IV SOLN: 2.5000 [IU]/h | INTRAVENOUS | Status: AC

## 2020-11-19 MED ORDER — POVIDONE-IODINE 10 % EX SWAB: 2.0000 "application " | Freq: Once | CUTANEOUS | Status: DC

## 2020-11-19 MED ORDER — CEFAZOLIN SODIUM-DEXTROSE 2-4 GM/100ML-% IV SOLN
INTRAVENOUS | Status: AC
  Filled 2020-11-19: qty 100

## 2020-11-19 MED ORDER — DIPHENHYDRAMINE HCL 50 MG/ML IJ SOLN: 12.5000 mg | INTRAMUSCULAR | Status: DC | PRN

## 2020-11-19 MED ORDER — BUPIVACAINE IN DEXTROSE 0.75-8.25 % IT SOLN
INTRATHECAL | Status: DC | PRN
Start: 2020-11-19 — End: 2020-11-19
  Administered 2020-11-19: 1.4 mL via INTRATHECAL

## 2020-11-19 MED ORDER — DIPHENHYDRAMINE HCL 25 MG PO CAPS: 25.0000 mg | ORAL_CAPSULE | Freq: Four times a day (QID) | ORAL | Status: DC | PRN

## 2020-11-19 MED ORDER — FENTANYL CITRATE (PF) 100 MCG/2ML IJ SOLN
INTRAMUSCULAR | Status: AC
  Filled 2020-11-19: qty 2

## 2020-11-19 MED ORDER — SCOPOLAMINE 1 MG/3DAYS TD PT72
MEDICATED_PATCH | TRANSDERMAL | Status: AC
  Filled 2020-11-19: qty 1

## 2020-11-19 MED ORDER — OXYTOCIN-SODIUM CHLORIDE 30-0.9 UT/500ML-% IV SOLN
INTRAVENOUS | Status: DC | PRN
  Administered 2020-11-19: 400 mL via INTRAVENOUS

## 2020-11-19 MED ORDER — ACETAMINOPHEN 10 MG/ML IV SOLN
INTRAVENOUS | Status: DC | PRN
  Administered 2020-11-19: 1000 mg via INTRAVENOUS

## 2020-11-19 MED ORDER — SIMETHICONE 80 MG PO CHEW
80.0000 mg | CHEWABLE_TABLET | Freq: Three times a day (TID) | ORAL | Status: DC
  Administered 2020-11-19 – 2020-11-21 (×6): 80 mg via ORAL
  Filled 2020-11-19 (×7): qty 1

## 2020-11-19 MED ORDER — HYDROMORPHONE HCL 1 MG/ML IJ SOLN
0.2500 mg | INTRAMUSCULAR | Status: DC | PRN
  Administered 2020-11-19 (×2): 0.5 mg via INTRAVENOUS

## 2020-11-19 MED ORDER — OXYCODONE HCL 5 MG/5ML PO SOLN
5.0000 mg | Freq: Once | ORAL | Status: DC | PRN
Start: 2020-11-19 — End: 2020-11-19

## 2020-11-19 MED ORDER — ACETAMINOPHEN 500 MG PO TABS
1000.0000 mg | ORAL_TABLET | Freq: Four times a day (QID) | ORAL | Status: DC
  Administered 2020-11-19 – 2020-11-21 (×8): 1000 mg via ORAL
  Filled 2020-11-19 (×9): qty 2

## 2020-11-19 MED ORDER — SCOPOLAMINE 1 MG/3DAYS TD PT72
MEDICATED_PATCH | TRANSDERMAL | Status: DC | PRN
  Administered 2020-11-19: 1 via TRANSDERMAL

## 2020-11-19 MED ORDER — KETOROLAC TROMETHAMINE 30 MG/ML IJ SOLN
30.0000 mg | Freq: Four times a day (QID) | INTRAMUSCULAR | Status: AC | PRN
  Administered 2020-11-19: 30 mg via INTRAVENOUS
  Filled 2020-11-19: qty 1

## 2020-11-19 MED ORDER — ZOLPIDEM TARTRATE 5 MG PO TABS: 5.0000 mg | ORAL_TABLET | Freq: Every evening | ORAL | Status: DC | PRN

## 2020-11-19 MED ORDER — DEXMEDETOMIDINE (PRECEDEX) IN NS 20 MCG/5ML (4 MCG/ML) IV SYRINGE
PREFILLED_SYRINGE | INTRAVENOUS | Status: DC | PRN
  Administered 2020-11-19: 8 ug via INTRAVENOUS

## 2020-11-19 MED ORDER — OXYCODONE HCL 5 MG PO TABS
5.0000 mg | ORAL_TABLET | ORAL | Status: DC | PRN
  Administered 2020-11-20 – 2020-11-21 (×2): 5 mg via ORAL
  Filled 2020-11-19 (×2): qty 1

## 2020-11-19 MED ORDER — NALBUPHINE HCL 10 MG/ML IJ SOLN: 5.0000 mg | INTRAMUSCULAR | Status: DC | PRN

## 2020-11-19 MED ORDER — NALOXONE HCL 0.4 MG/ML IJ SOLN: 0.4000 mg | INTRAMUSCULAR | Status: DC | PRN

## 2020-11-19 MED ORDER — IBUPROFEN 800 MG PO TABS
800.0000 mg | ORAL_TABLET | Freq: Four times a day (QID) | ORAL | Status: DC
  Administered 2020-11-19 – 2020-11-21 (×6): 800 mg via ORAL
  Filled 2020-11-19 (×7): qty 1

## 2020-11-19 MED ORDER — NALOXONE HCL 4 MG/10ML IJ SOLN
1.0000 ug/kg/h | INTRAVENOUS | Status: DC | PRN
  Filled 2020-11-19: qty 5

## 2020-11-19 MED ORDER — WITCH HAZEL-GLYCERIN EX PADS: 1.0000 "application " | MEDICATED_PAD | CUTANEOUS | Status: DC | PRN

## 2020-11-19 MED ORDER — MORPHINE SULFATE (PF) 0.5 MG/ML IJ SOLN
INTRAMUSCULAR | Status: DC | PRN
  Administered 2020-11-19: .15 mg via EPIDURAL

## 2020-11-19 MED ORDER — ACETAMINOPHEN 10 MG/ML IV SOLN: INTRAVENOUS | Status: DC | PRN

## 2020-11-19 MED ORDER — SODIUM CHLORIDE 0.9% FLUSH: 3.0000 mL | INTRAVENOUS | Status: DC | PRN

## 2020-11-19 MED ORDER — MIDAZOLAM HCL 2 MG/2ML IJ SOLN
INTRAMUSCULAR | Status: DC | PRN
  Administered 2020-11-19: 2 mg via INTRAVENOUS

## 2020-11-19 MED ORDER — ACETAMINOPHEN 500 MG PO TABS: 1000.0000 mg | ORAL_TABLET | Freq: Four times a day (QID) | ORAL | Status: DC

## 2020-11-19 MED ORDER — PHENYLEPHRINE HCL-NACL 20-0.9 MG/250ML-% IV SOLN
INTRAVENOUS | Status: DC | PRN
  Administered 2020-11-19: 60 ug/min via INTRAVENOUS

## 2020-11-19 MED ORDER — PHENYLEPHRINE 40 MCG/ML (10ML) SYRINGE FOR IV PUSH (FOR BLOOD PRESSURE SUPPORT)
PREFILLED_SYRINGE | INTRAVENOUS | Status: DC | PRN
  Administered 2020-11-19: 80 ug via INTRAVENOUS

## 2020-11-19 MED ORDER — KETOROLAC TROMETHAMINE 30 MG/ML IJ SOLN
30.0000 mg | Freq: Once | INTRAMUSCULAR | Status: AC | PRN
  Administered 2020-11-19: 30 mg via INTRAVENOUS

## 2020-11-19 MED ORDER — KETOROLAC TROMETHAMINE 30 MG/ML IJ SOLN
INTRAMUSCULAR | Status: AC
  Filled 2020-11-19: qty 1

## 2020-11-19 MED ORDER — KETOROLAC TROMETHAMINE 30 MG/ML IJ SOLN: 30.0000 mg | Freq: Four times a day (QID) | INTRAMUSCULAR | Status: AC | PRN

## 2020-11-19 MED ORDER — COCONUT OIL OIL
1.0000 "application " | TOPICAL_OIL | Status: DC | PRN
  Administered 2020-11-20: 1 via TOPICAL

## 2020-11-19 MED ORDER — CEFAZOLIN SODIUM-DEXTROSE 2-4 GM/100ML-% IV SOLN
2.0000 g | INTRAVENOUS | Status: AC
  Administered 2020-11-19: 2 g via INTRAVENOUS

## 2020-11-19 MED ORDER — MORPHINE SULFATE (PF) 0.5 MG/ML IJ SOLN
INTRAMUSCULAR | Status: AC
  Filled 2020-11-19: qty 10

## 2020-11-19 MED ORDER — OXYCODONE HCL 5 MG PO TABS: 5.0000 mg | ORAL_TABLET | Freq: Once | ORAL | Status: DC | PRN

## 2020-11-19 MED ORDER — SCOPOLAMINE 1 MG/3DAYS TD PT72: 1.0000 | MEDICATED_PATCH | Freq: Once | TRANSDERMAL | Status: DC

## 2020-11-19 MED ORDER — FENTANYL CITRATE (PF) 100 MCG/2ML IJ SOLN
INTRAMUSCULAR | Status: DC | PRN
  Administered 2020-11-19: 15 ug via INTRAVENOUS

## 2020-11-19 MED ORDER — BUPIVACAINE HCL (PF) 0.25 % IJ SOLN
INTRAMUSCULAR | Status: AC
  Filled 2020-11-19: qty 20

## 2020-11-19 MED ORDER — DIPHENHYDRAMINE HCL 25 MG PO CAPS: 25.0000 mg | ORAL_CAPSULE | ORAL | Status: DC | PRN

## 2020-11-19 MED ORDER — PROMETHAZINE HCL 25 MG/ML IJ SOLN: 6.2500 mg | INTRAMUSCULAR | Status: DC | PRN

## 2020-11-19 MED ORDER — DEXAMETHASONE SODIUM PHOSPHATE 4 MG/ML IJ SOLN
INTRAMUSCULAR | Status: DC | PRN
  Administered 2020-11-19: 4 mg via INTRAVENOUS

## 2020-11-19 MED ORDER — SIMETHICONE 80 MG PO CHEW: 80.0000 mg | CHEWABLE_TABLET | ORAL | Status: DC | PRN

## 2020-11-19 MED ORDER — MIDAZOLAM HCL 2 MG/2ML IJ SOLN
INTRAMUSCULAR | Status: AC
  Filled 2020-11-19: qty 2

## 2020-11-19 MED ORDER — PRENATAL MULTIVITAMIN CH
1.0000 | ORAL_TABLET | Freq: Every day | ORAL | Status: DC
  Administered 2020-11-19 – 2020-11-21 (×3): 1 via ORAL
  Filled 2020-11-19 (×3): qty 1

## 2020-11-19 MED ORDER — TETANUS-DIPHTH-ACELL PERTUSSIS 5-2.5-18.5 LF-MCG/0.5 IM SUSY: 0.5000 mL | PREFILLED_SYRINGE | Freq: Once | INTRAMUSCULAR | Status: DC

## 2020-11-19 MED ORDER — BUPIVACAINE HCL 0.25 % IJ SOLN
INTRAMUSCULAR | Status: DC | PRN
  Administered 2020-11-19: 20 mL

## 2020-11-19 MED ORDER — ONDANSETRON HCL 4 MG/2ML IJ SOLN
INTRAMUSCULAR | Status: DC | PRN
  Administered 2020-11-19: 4 mg via INTRAVENOUS

## 2020-11-19 SURGICAL SUPPLY — 38 items
ADH SKN CLS APL DERMABOND .7 (GAUZE/BANDAGES/DRESSINGS)
APL SKNCLS STERI-STRIP NONHPOA (GAUZE/BANDAGES/DRESSINGS) ×1
BENZOIN TINCTURE PRP APPL 2/3 (GAUZE/BANDAGES/DRESSINGS) ×2 IMPLANT
CHLORAPREP W/TINT 26ML (MISCELLANEOUS) ×2 IMPLANT
CLAMP CORD UMBIL (MISCELLANEOUS) IMPLANT
CLOTH BEACON ORANGE TIMEOUT ST (SAFETY) ×2 IMPLANT
CLSR STERI-STRIP ANTIMIC 1/2X4 (GAUZE/BANDAGES/DRESSINGS) ×1 IMPLANT
DERMABOND ADVANCED (GAUZE/BANDAGES/DRESSINGS)
DERMABOND ADVANCED .7 DNX12 (GAUZE/BANDAGES/DRESSINGS) IMPLANT
DRSG OPSITE POSTOP 4X10 (GAUZE/BANDAGES/DRESSINGS) ×2 IMPLANT
ELECT REM PT RETURN 9FT ADLT (ELECTROSURGICAL) ×2
ELECTRODE REM PT RTRN 9FT ADLT (ELECTROSURGICAL) ×1 IMPLANT
EXTRACTOR VACUUM KIWI (MISCELLANEOUS) IMPLANT
GAUZE SPONGE 4X4 8PLY STRL (GAUZE/BANDAGES/DRESSINGS) ×2 IMPLANT
GLOVE BIO SURGEON STRL SZ 6 (GLOVE) ×2 IMPLANT
GLOVE BIOGEL PI IND STRL 6 (GLOVE) ×2 IMPLANT
GLOVE BIOGEL PI IND STRL 7.0 (GLOVE) ×1 IMPLANT
GLOVE BIOGEL PI INDICATOR 6 (GLOVE) ×2
GLOVE BIOGEL PI INDICATOR 7.0 (GLOVE) ×1
GOWN STRL REUS W/TWL LRG LVL3 (GOWN DISPOSABLE) ×4 IMPLANT
KIT ABG SYR 3ML LUER SLIP (SYRINGE) ×2 IMPLANT
NDL HYPO 25X5/8 SAFETYGLIDE (NEEDLE) ×1 IMPLANT
NEEDLE HYPO 25X5/8 SAFETYGLIDE (NEEDLE) ×2 IMPLANT
NS IRRIG 1000ML POUR BTL (IV SOLUTION) ×2 IMPLANT
PACK C SECTION WH (CUSTOM PROCEDURE TRAY) ×2 IMPLANT
PAD ABD 8X10 STRL (GAUZE/BANDAGES/DRESSINGS) ×1 IMPLANT
PAD OB MATERNITY 4.3X12.25 (PERSONAL CARE ITEMS) ×2 IMPLANT
PENCIL SMOKE EVAC W/HOLSTER (ELECTROSURGICAL) ×2 IMPLANT
STRIP CLOSURE SKIN 1/2X4 (GAUZE/BANDAGES/DRESSINGS) IMPLANT
SUT CHROMIC 0 CTX 36 (SUTURE) ×6 IMPLANT
SUT MON AB 2-0 CT1 27 (SUTURE) ×2 IMPLANT
SUT PDS AB 0 CT1 27 (SUTURE) IMPLANT
SUT PLAIN 0 NONE (SUTURE) IMPLANT
SUT VIC AB 0 CT1 36 (SUTURE) IMPLANT
SUT VIC AB 4-0 KS 27 (SUTURE) IMPLANT
TOWEL OR 17X24 6PK STRL BLUE (TOWEL DISPOSABLE) ×2 IMPLANT
TRAY FOLEY W/BAG SLVR 14FR LF (SET/KITS/TRAYS/PACK) IMPLANT
WATER STERILE IRR 1000ML POUR (IV SOLUTION) ×2 IMPLANT

## 2020-11-19 NOTE — Progress Notes (Signed)
No change to H&P.  Amber Gerst, DO 

## 2020-11-19 NOTE — Anesthesia Preprocedure Evaluation (Addendum)
Anesthesia Evaluation  Patient identified by MRN, date of birth, ID band Patient awake    Reviewed: Allergy & Precautions, NPO status , Patient's Chart, lab work & pertinent test results  History of Anesthesia Complications (+) PONV and history of anesthetic complications (did well with last c section)  Airway Mallampati: II  TM Distance: >3 FB Neck ROM: Full    Dental no notable dental hx. (+) Dental Advisory Given   Pulmonary neg pulmonary ROS,    Pulmonary exam normal breath sounds clear to auscultation       Cardiovascular negative cardio ROS Normal cardiovascular exam Rhythm:Regular Rate:Normal     Neuro/Psych PSYCHIATRIC DISORDERS Anxiety Depression negative neurological ROS     GI/Hepatic negative GI ROS, Neg liver ROS,   Endo/Other  negative endocrine ROS  Renal/GU negative Renal ROS  negative genitourinary   Musculoskeletal negative musculoskeletal ROS (+)   Abdominal   Peds  Hematology negative hematology ROS (+) hct 37.3, plt 157   Anesthesia Other Findings   Reproductive/Obstetrics (+) Pregnancy 1 prior section 2018                            Anesthesia Physical Anesthesia Plan  ASA: 2  Anesthesia Plan: Spinal   Post-op Pain Management:    Induction:   PONV Risk Score and Plan: 3 and Ondansetron, Dexamethasone and Treatment may vary due to age or medical condition  Airway Management Planned: Natural Airway and Nasal Cannula  Additional Equipment: None  Intra-op Plan:   Post-operative Plan:   Informed Consent: I have reviewed the patients History and Physical, chart, labs and discussed the procedure including the risks, benefits and alternatives for the proposed anesthesia with the patient or authorized representative who has indicated his/her understanding and acceptance.       Plan Discussed with: CRNA  Anesthesia Plan Comments:         Anesthesia  Quick Evaluation

## 2020-11-19 NOTE — Lactation Note (Signed)
This note was copied from a baby's chart. Lactation Consultation Note  Patient Name: Amber Richard GUYQI'H Date: 11/19/2020 Reason for consult: Initial assessment Age:35  Initial visit to 34 hours old infant of a P2 mother. Mother requests assistance with latch. Infant unable to latch after several attempts. Infant is sleepy and uninterested. Demonstrated hand expression, collected ~1-mL and spoonfed.  Discussed normal newborn behavior and patterns, signs of good milk transfer, hunger cues, tummy size and benefits of skin to skin.   Plan: 1-Breastfeeding on demand or 8-12 times in 24h period. 2-Hand express as needed 3-Encouraged maternal rest, hydration and food intake.   Contact LC as needed for feeds/support/concerns/questions. All questions answered at this time. Provided Lactation services brochure and promoted [employer] booklet information.     Maternal Data Has patient been taught Hand Expression?: Yes Does the patient have breastfeeding experience prior to this delivery?: Yes How long did the patient breastfeed?: 4 months  Feeding Mother's Current Feeding Choice: Breast Milk  LATCH Score Latch: Too sleepy or reluctant, no latch achieved, no sucking elicited.  Audible Swallowing: None  Type of Nipple: Everted at rest and after stimulation (short shafted)  Comfort (Breast/Nipple): Soft / non-tender  Hold (Positioning): Assistance needed to correctly position infant at breast and maintain latch.  LATCH Score: 5  Interventions Interventions: Breast feeding basics reviewed;Assisted with latch;Skin to skin;Breast massage;Hand express;Adjust position;Expressed milk;Education  Discharge Pump: Personal WIC Program: No  Consult Status Consult Status: Follow-up Date: 11/20/20 Follow-up type: In-patient    Carrieanne Kleen A Higuera Ancidey 11/19/2020, 10:13 PM

## 2020-11-19 NOTE — Anesthesia Postprocedure Evaluation (Signed)
Anesthesia Post Note  Patient: Tenia Koffler  Procedure(s) Performed: REPEAT CESAREAN SECTION EDC: 11-24-20  ALLERG: LATEX  PREVIOUS X 1     Patient location during evaluation: PACU Anesthesia Type: Spinal Level of consciousness: awake and alert and oriented Pain management: pain level controlled Vital Signs Assessment: post-procedure vital signs reviewed and stable Respiratory status: spontaneous breathing, nonlabored ventilation and respiratory function stable Cardiovascular status: blood pressure returned to baseline and stable Postop Assessment: no headache, no backache, spinal receding and patient able to bend at knees Anesthetic complications: no   No notable events documented.  Last Vitals:  Vitals:   11/19/20 0930 11/19/20 0945  BP: 92/71 105/78  Pulse: (!) 59 (!) 59  Resp: (!) 23 14  Temp: (!) 36.3 C   SpO2: 98% 99%    Last Pain:  Vitals:   11/19/20 0945  TempSrc:   PainSc: 5    Pain Goal:    LLE Motor Response: Purposeful movement (11/19/20 0945)   RLE Motor Response: Purposeful movement (11/19/20 0945)       Epidural/Spinal Function Cutaneous sensation: Able to Discern Pressure (11/19/20 0945), Patient able to flex knees: Yes (11/19/20 0945), Patient able to lift hips off bed: Yes (11/19/20 0945), Back pain beyond tenderness at insertion site: No (11/19/20 0945), Progressively worsening motor and/or sensory loss: No (11/19/20 0945), Bowel and/or bladder incontinence post epidural: No (11/19/20 0945)  Lannie Fields

## 2020-11-19 NOTE — Op Note (Signed)
Amber Richard PROCEDURE DATE: 11/19/2020  PREOPERATIVE DIAGNOSIS: Intrauterine pregnancy at [redacted]w[redacted]d gestation for repeat C/S (previous x 1)  POSTOPERATIVE DIAGNOSIS: The same  PROCEDURE:  Repeat Low Transverse Cesarean Section  SURGEON:  Dr. Mitchel Honour  INDICATIONS: Amber Richard is a 35 y.o. G2P1 at [redacted]w[redacted]d scheduled for cesarean section secondary to desire for repeat.  The risks of cesarean section discussed with the patient included but were not limited to: bleeding which may require transfusion or reoperation; infection which may require antibiotics; injury to bowel, bladder, ureters or other surrounding organs; injury to the fetus; need for additional procedures including hysterectomy in the event of a life-threatening hemorrhage; placental abnormalities wth subsequent pregnancies, incisional problems, thromboembolic phenomenon and other postoperative/anesthesia complications. The patient concurred with the proposed plan, giving informed written consent for the procedure.    FINDINGS:  Viable female infant in cephalic presentation, APGARs 9, 10:  weight pending  Clear amniotic fluid.  Intact placenta, three vessel cord.  Grossly normal uterus, ovaries and fallopian tubes. .   ANESTHESIA: Spinal ESTIMATED BLOOD LOSS: 800 ml SPECIMENS: Placenta sent to L&D COMPLICATIONS: None immediate  PROCEDURE IN DETAIL:  The patient received intravenous antibiotics and had sequential compression devices applied to her lower extremities while in the preoperative area.  She was then taken to the operating room where spinal anesthesia was administered and was found to be adequate. She was then placed in a dorsal supine position with a leftward tilt, and prepped and draped in a sterile manner.  A foley catheter was placed into her bladder and attached to constant gravity.  After an adequate timeout was performed, a Pfannenstiel skin incision was made with scalpel over previous scar and carried through to the  underlying layer of fascia. The fascia was incised in the midline and this incision was extended bilaterally using the Mayo scissors. Kocher clamps were applied to the superior aspect of the fascial incision and the underlying rectus muscles were dissected off bluntly. A similar process was carried out on the inferior aspect of the facial incision. The rectus muscles were separated in the midline bluntly and the peritoneum was entered bluntly.   Bladder flap was created sharply and developed bluntly.  Bladder blade was placed.  A transverse hysterotomy was made with a scalpel and extended bilaterally bluntly. The bladder blade was then removed. The infant was successfully delivered using a single Kiwi vacuum pull, and cord was clamped and cut and infant was handed over to awaiting neonatology team. Uterine massage was then administered and the placenta delivered intact with three-vessel cord. The uterus was cleared of clot and debris.  The hysterotomy was closed with 0 chromic.  A second imbricating suture of 0-chromic was used to reinforce the incision and aid in hemostasis.  The peritoneum and rectus muscles were noted to be hemostatic and were reapproximated using 2-0 monocryl running stitch.  The fascia was closed with 0-PDS in a running fashion with good restoration of anatomy.  The subcutaneus tissue was copiously irrigated.  Skin incision was noted to have extended during delivery by about 5 cm on the right aspect of the Pfannenstiel.  The skin was closed with 4-0 vicryl in a subcuticular fashion.  Pt tolerated the procedure will.  All counts were correct x2.  Pt went to the recovery room in stable condition.

## 2020-11-19 NOTE — Transfer of Care (Signed)
Immediate Anesthesia Transfer of Care Note  Patient: Amber Richard  Procedure(s) Performed: REPEAT CESAREAN SECTION EDC: 11-24-20  ALLERG: LATEX  PREVIOUS X 1  Patient Location: PACU  Anesthesia Type:Spinal  Level of Consciousness: awake, alert  and oriented  Airway & Oxygen Therapy: Patient Spontanous Breathing  Post-op Assessment: Report given to RN and Post -op Vital signs reviewed and stable  Post vital signs: Reviewed and stable  Last Vitals:  Vitals Value Taken Time  BP 96/67 11/19/20 0848  Temp    Pulse 66 11/19/20 0855  Resp 17 11/19/20 0855  SpO2 97 % 11/19/20 0855  Vitals shown include unvalidated device data.  Last Pain:  Vitals:   11/19/20 0603  TempSrc: Oral  PainSc: 0-No pain         Complications: No notable events documented.

## 2020-11-19 NOTE — Anesthesia Procedure Notes (Signed)
Spinal  Patient location during procedure: OR Start time: 11/19/2020 7:20 AM End time: 11/19/2020 7:25 AM Reason for block: surgical anesthesia Staffing Performed: anesthesiologist  Anesthesiologist: Lannie Fields, DO Preanesthetic Checklist Completed: patient identified, IV checked, risks and benefits discussed, surgical consent, monitors and equipment checked, pre-op evaluation and timeout performed Spinal Block Patient position: sitting Prep: DuraPrep and site prepped and draped Patient monitoring: cardiac monitor, continuous pulse ox and blood pressure Approach: midline Location: L3-4 Injection technique: single-shot Needle Needle type: Pencan  Needle gauge: 24 G Needle length: 9 cm Assessment Sensory level: T6 Events: CSF return and second provider Additional Notes Functioning IV was confirmed and monitors were applied. Sterile prep and drape, including hand hygiene and sterile gloves were used. The patient was positioned and the spine was prepped. The skin was anesthetized with lidocaine.  Free flow of clear CSF was obtained prior to injecting local anesthetic into the CSF.  The spinal needle aspirated freely following injection.  The needle was carefully withdrawn.  The patient tolerated the procedure well.

## 2020-11-19 NOTE — Lactation Note (Signed)
This note was copied from a baby's chart. Lactation Consultation Note  Patient Name: Boy Aldine Chakraborty [id number] Date: 11/19/2020 Age:34 hours  LC in to room per mother's request. Mother states she prefers LC visit at a later time when FOB is present. Encouraged to call when ready for visit.   Jaye Polidori A Higuera Ancidey 11/19/2020, 8:32 PM

## 2020-11-20 LAB — CBC
HCT: 26.5 % — ABNORMAL LOW (ref 36.0–46.0)
Hemoglobin: 8.7 g/dL — ABNORMAL LOW (ref 12.0–15.0)
MCH: 29.5 pg (ref 26.0–34.0)
MCHC: 32.8 g/dL (ref 30.0–36.0)
MCV: 89.8 fL (ref 80.0–100.0)
Platelets: 118 10*3/uL — ABNORMAL LOW (ref 150–400)
RBC: 2.95 MIL/uL — ABNORMAL LOW (ref 3.87–5.11)
RDW: 12.8 % (ref 11.5–15.5)
WBC: 8 10*3/uL (ref 4.0–10.5)
nRBC: 0 % (ref 0.0–0.2)

## 2020-11-20 NOTE — Lactation Note (Signed)
This note was copied from a baby's chart. Lactation Consultation Note  Patient Name: Amber Richard [id number] Date: 11/20/2020 Reason for consult: Follow-up assessment Age:34 hours  P2, Request to assist with latching.  Baby was circumcised this morning. Repositioned baby to football hold and baby latched easily. Feed on demand with cues.  Goal 8-12+ times per day after first 24 hrs.  Place baby STS if not cueing.    Feeding Mother's Current Feeding Choice: Breast Milk  LATCH Score Latch: Grasps breast easily, tongue down, lips flanged, rhythmical sucking.  Audible Swallowing: A few with stimulation  Type of Nipple: Everted at rest and after stimulation  Comfort (Breast/Nipple): Soft / non-tender  Hold (Positioning): Assistance needed to correctly position infant at breast and maintain latch.  LATCH Score: 8   Interventions Interventions: Breast feeding basics reviewed;Assisted with latch  Consult Status Consult Status: Follow-up Date: 11/20/20 Follow-up type: In-patient    Dahlia Byes [facility] 11/20/2020, 12:36 PM

## 2020-11-20 NOTE — Progress Notes (Signed)
POD # 1  Doing well. No complaints.  BP (!) 88/60 (BP Location: Right Arm)   Pulse (!) 56   Temp 97.6 F (36.4 C) (Oral)   Resp 16   Ht 5\' 6"  (1.676 m)   Wt 72.1 kg   SpO2 99%   Breastfeeding Unknown   BMI 25.66 kg/m  Results for orders placed or performed during the hospital encounter of 11/19/20 (from the past 24 hour(s))  CBC     Status: Abnormal   Collection Time: 11/20/20  5:26 AM  Result Value Ref Range   WBC 8.0 4.0 - 10.5 K/uL   RBC 2.95 (L) 3.87 - 5.11 MIL/uL   Hemoglobin 8.7 (L) 12.0 - 15.0 g/dL   HCT 11/22/20 (L) 70.4 - 88.8 %   MCV 89.8 80.0 - 100.0 fL   MCH 29.5 26.0 - 34.0 pg   MCHC 32.8 30.0 - 36.0 g/dL   RDW 91.6 94.5 - 03.8 %   Platelets 118 (L) 150 - 400 K/uL   nRBC 0.0 0.0 - 0.2 %   Abdomen is soft and non tender Pressure dressing removed and is dry  [place] is dry  POD # 1  Doing well Routine care Discharge home tomorrow  Circ performed

## 2020-11-20 NOTE — Lactation Note (Addendum)
This note was copied from a baby's chart. Lactation Consultation Note  Patient Name: Amber Richard PZWCH'E Date: 11/20/2020 Reason for consult: Follow-up assessment;Mother's request;Term;Breast augmentation Age:34 hours  LC alerted RN, Verlan Friends, Mother concerned not have enough to satisfy infant. Infant multiple stools and 2 urine. Mom feeding for last hr infant still cueing. LC worked on different positions but no audible swallows noted with breast compression.  Mom like to supplement as she works on increasing her let down with pumping.  DBM consent completed and placed in chart.   Mom pumped and breast feed last child 2 months noticed low milk supply. Since last year she had recent breast augmentation not sure if cut in muscle or around the nipple.   Plan 1.to feed based on cues 8-12x 24 hr period. Mom to offer breast with compression and listen for audible swallows.  2. Mom to supplement with EBM first followed by [facility] with paced bottle feeding with yellow slow flow nipple.  3. DEBP q 3 hrs for .   All questions answered at the end of the visit.   LC alerted RN, Verlan Friends with changes noted above.  Maternal Data Has patient been taught Hand Expression?: Yes Does the patient have breastfeeding experience prior to this delivery?: Yes How long did the patient breastfeed?: 2 months wtih low milk supply prior to breast augmentation. Mom had breast implants last year not sure incision think may be around areola.  Feeding Mother's Current Feeding Choice: Breast Milk and Donor Milk Nipple Type: Slow - flow  LATCH Score                    Lactation Tools Discussed/Used Tools: Pump;Flanges Flange Size: 24 Breast pump type: Double-Electric Breast Pump Pump Education: Setup, frequency, and cleaning;Milk Storage Reason for Pumping: increase stimulation Pumping frequency: every 3 hrs for 15 min  Interventions Interventions: Breast feeding basics  reviewed;Breast compression;Assisted with latch;Adjust position;Skin to skin;Support pillows;DEBP;Breast massage;Position options;Hand express;Expressed milk;Education;Pace feeding  Discharge Pump: Personal  Consult Status Consult Status: Follow-up Date: 11/21/20 Follow-up type: In-patient    Syncere Eble  Nicholson-Springer 11/20/2020, 5:33 PM

## 2020-11-20 NOTE — Social Work (Signed)
CSW received consult for hx of Anxiety. CSW met with MOB to offer support and complete assessment.    CSW met with MOB at bedside and introduced CSW role. CSW observed FOB sitting and chair and maternal grandmother and father present as well. CSW offered to return at different time. MOB welcomed CSW to compete the assessment with her family present. MOB presented pleasant and receptive to [place] visit. CSW inquired how MOB has felt since giving birth. MOB expressed feeling happy with some soreness. MOB described her L&D experience as much better than her last since her C-section was planned. MOB shared during the pregnancy she felt emotional and had anxiety. MOB disclosed she was diagnosed with anxiety in 2007-2008 and takes Citalopram for her symptoms which she finds helpful. She recently stopped taking Citalopram due to the pregnancy. MOB is not certain if she will continue taking the medication postpartum. MOB shared to cope with her anxiety she stays busy. CSW inquired if MOB experienced postpartum depression. MOB shared she experienced postpartum depression/anxiety (2019) as evidenced by feeling anxious and tearful postpartum.  CSW provided education regarding the baby blues period vs. perinatal mood disorders, discussed treatment and gave resources for mental health follow up if concerns arise.  CSW recommended MOB complete a self-evaluation during the postpartum time period using the New Mom Checklist from Postpartum Progress and encouraged MOB to contact a medical professional if symptoms are noted at any time. MOB reported she feel comfortable reaching out to her doctor if concerns arise. CSW assessed MOB for safety. MOB denied thought of harm to self and others. CSW inquired about supports. MOB identified her spouse, parents, in-laws and several other relatives as supports.   CSW provided review of Sudden Infant Death Syndrome (SIDS) precautions. MOB reported she has essential items for the infant  including a crib where the infant will sleep. MOB has chosen [facility] for infant's follow up care. CSW assessed MOB for additional needs. MOB reported no further need.   CSW identifies no further need for intervention and no barriers to discharge at this time.   Kathrin Greathouse, MSW, LCSW Women's and [place] Worker  [phone] 11/20/2020  4:36 PM

## 2020-11-21 MED ORDER — IBUPROFEN 600 MG PO TABS: 600.0000 mg | ORAL_TABLET | Freq: Four times a day (QID) | ORAL | 0 refills | Status: DC | PRN

## 2020-11-21 MED ORDER — FERROUS SULFATE 325 (65 FE) MG PO TBEC: 325.0000 mg | DELAYED_RELEASE_TABLET | Freq: Two times a day (BID) | ORAL | 2 refills | Status: DC

## 2020-11-21 MED ORDER — DOCUSATE SODIUM 100 MG PO CAPS: 200.0000 mg | ORAL_CAPSULE | Freq: Two times a day (BID) | ORAL | 0 refills | Status: DC

## 2020-11-21 MED ORDER — OXYCODONE HCL 5 MG PO TABS: 5.0000 mg | ORAL_TABLET | ORAL | 0 refills | Status: DC | PRN

## 2020-11-21 NOTE — Social Work (Signed)
CSW received consult due to score 10 on Edinburgh Depression Screen.    CSW met with MOB on 9/27 to process her emotions and postpartum plans regarding mental health follow up. CSW provided education regarding Baby Blues vs PMADs and provided MOB with resources. CSW encouraged MOB to evaluate her mental health throughout the postpartum period with the use of the New Mom Checklist developed by Postpartum Progress as well as the [place] Postnatal Depression Scale and notify a medical professional if symptoms arise. MOB informed CSW she feels comfortable reaching out to her doctor if concerns arise.   Kathrin Greathouse, MSW, LCSW Women's and [place] Worker  [phone] 11/21/2020  9:44 AM

## 2020-11-21 NOTE — Lactation Note (Signed)
This note was copied from a baby's chart. Lactation Consultation Note  Patient Name: Amber Richard [id number] Date: 11/21/2020 Reason for consult: Follow-up assessment;Term;Breast augmentation;RN request Age:34 hours  Concern regarding breast surgery and peri-areolar incisions.  RN requested [facility] consult on day of discharge.  Mom has been supplementing baby due to concern with Mom's milk supply as baby was feeding for long periods and not acting contented.  Baby cueing and offered to assist with positioning and latching.  Baby noted to have a short posterior lingual frenulum with a U shaped tongue when crying.  Palate normal.  Baby latched deeply easily in football hold on right breast.  Baby fed consistently with deep jaw extensions and occasional swallows.  After 20 mins, baby came off the breast and nipple was rounded. Baby cueing and Mom slid baby over to left breast in cross cradle.  Baby latched deeply without discomfort felt.  Mom to use compression.  Mom will pump after breastfeeding every or every other feeding to support her milk supply.  Encouraged STS and feeding baby often with cues.  Engorgement prevention and treatment reviewed.  Mom referred to OP lactation and message sent.  LATCH Score Latch: Grasps breast easily, tongue down, lips flanged, rhythmical sucking.  Audible Swallowing: Spontaneous and intermittent  Type of Nipple: Everted at rest and after stimulation  Comfort (Breast/Nipple): Soft / non-tender  Hold (Positioning): Assistance needed to correctly position infant at breast and maintain latch.  LATCH Score: 9   Lactation Tools Discussed/Used Tools: Pump;Flanges Pump Education: Setup, frequency, and cleaning;Milk Storage Reason for Pumping: breast augmentation surgery/history of low milk supply Pumping frequency: Q 3 hrs pc Pumped volume: 2 mL  Interventions Interventions: Breast feeding basics reviewed;Assisted with latch;Skin to skin;Breast  massage;Hand express;Breast compression;Adjust position;Support pillows;Position options;DEBP;Hand pump;Education  Discharge Discharge Education: Engorgement and breast care;Warning signs for feeding baby;Outpatient recommendation;Outpatient Epic message sent Pump: Personal (Medela PIS) WIC Program: No  Consult Status Consult Status: Complete Date: 11/21/20 Follow-up type: Out-patient    Judee Clara 11/21/2020, 1:55 PM

## 2020-11-21 NOTE — Discharge Summary (Signed)
Postpartum Discharge Summary  Date of Service updated 11/21/20     Patient Name: Amber Richard DOB: [birth year] MRN: [id number]  Date of admission: 11/19/2020 Delivery date:11/19/2020  Delivering provider: Mitchel Honour  Date of discharge: 11/21/2020  Admitting diagnosis: Previous cesarean section [Z98.891] Intrauterine pregnancy: Unknown     Secondary diagnosis:  Active Problems:   Previous cesarean section  Additional problems: None    Discharge diagnosis: Term Pregnancy Delivered                                              Post partum procedures: None Augmentation: N/A Complications: None  Hospital course: Sceduled C/S   35 y.o. yo G2P2 at Unknown was admitted to the hospital 11/19/2020 for scheduled cesarean section with the following indication:Elective Repeat.Delivery details are as follows:  Membrane Rupture Time/Date: 7:58 AM ,11/19/2020   Delivery Method:C-Section, Vacuum Assisted  Details of operation can be found in separate operative note.  Patient had an uncomplicated postpartum course.  She is ambulating, tolerating a regular diet, passing flatus, and urinating well. Patient is discharged home in stable condition on  11/21/20        Newborn Data: Birth date:11/19/2020  Birth time:7:59 AM  Gender:Female  Living status:Living  Apgars:9 ,10  Weight:3946 g     Transfusion:No  Physical exam  Vitals:   11/20/20 0120 11/20/20 0535 11/20/20 1935 11/21/20 0512  BP: (!) 89/58 (!) 88/60 (!) 101/55 98/69  Pulse: 60 (!) 56 61 60  Resp: 15 16 16 18   Temp: 97.8 F (36.6 C) 97.6 F (36.4 C) 97.9 F (36.6 C) 98 F (36.7 C)  TempSrc: Oral Oral Axillary Oral  SpO2: 99% 99%  100%  Weight:      Height:       General: alert and cooperative Lochia: appropriate Uterine Fundus: firm Incision: Healing well with no significant drainage, No significant erythema, Dressing is clean, dry, and intact DVT Evaluation: No evidence of DVT seen on physical exam. Labs: Lab  Results  Component Value Date   WBC 8.0 11/20/2020   HGB 8.7 (L) 11/20/2020   HCT 26.5 (L) 11/20/2020   MCV 89.8 11/20/2020   PLT 118 (L) 11/20/2020   CMP Latest Ref Rng & Units 08/01/2019  Glucose 65 - 99 mg/dL 84  BUN 6 - 20 mg/dL 14  Creatinine 10/01/2019 - 5.18 mg/dL 8.41  Sodium 6.60 - 630 mmol/L 141  Potassium 3.5 - 5.2 mmol/L 4.5  Chloride 96 - 106 mmol/L 101  CO2 20 - 29 mmol/L 27  Calcium 8.7 - 10.2 mg/dL 9.3  Total Protein 6.0 - 8.5 g/dL 7.1  Total Bilirubin 0.0 - 1.2 mg/dL 0.5  Alkaline Phos 48 - 121 IU/L 56  AST 0 - 40 IU/L 22  ALT 0 - 32 IU/L 13   Edinburgh Score: Edinburgh Postnatal Depression Scale Screening Tool 11/20/2020  I have been able to laugh and see the funny side of things. (No Data)  I have looked forward with enjoyment to things. -  I have blamed myself unnecessarily when things went wrong. -  I have been anxious or worried for no good reason. -  I have felt scared or panicky for no good reason. -  Things have been getting on top of me. -  I have been so unhappy that I have had difficulty sleeping. -  I have  felt sad or miserable. -  I have been so unhappy that I have been crying. -  The thought of harming myself has occurred to me. Inocente Salles Postnatal Depression Scale Total -      After visit meds:  Allergies as of 11/21/2020       Reactions   Latex Rash        Medication List     TAKE these medications    docusate sodium 100 MG capsule Commonly known as: COLACE Take 2 capsules (200 mg total) by mouth 2 (two) times daily. What changed:  when to take this reasons to take this   ferrous sulfate 325 (65 FE) MG EC tablet Take 1 tablet (325 mg total) by mouth 2 (two) times daily.   FOLIC ACID PO Take 1 tablet by mouth daily.   ibuprofen 600 MG tablet Commonly known as: ADVIL Take 1 tablet (600 mg total) by mouth every 6 (six) hours as needed.   oxyCODONE 5 MG immediate release tablet Commonly known as: Oxy IR/ROXICODONE Take 1  tablet (5 mg total) by mouth every 4 (four) hours as needed for severe pain.   PRENATAL GUMMIES PO Take 2 capsules by mouth daily.   Vitamin C Chew Chew 2 tablets by mouth daily.         Discharge home in stable condition Infant Feeding: Bottle and Breast Infant Disposition:home with mother Discharge instruction: per After Visit Summary and Postpartum booklet. Activity: Advance as tolerated. Pelvic rest for 6 weeks.  Diet: routine diet Anticipated Birth Control: Unsure Postpartum Appointment:6 weeks Additional Postpartum F/U:  none Future Appointments:No future appointments. Follow up Visit:      11/21/2020 Ranae Pila, MD

## 2020-11-23 ENCOUNTER — Emergency Department (HOSPITAL_COMMUNITY)
Admission: EM | Admit: 2020-11-23 | Discharge: 2020-11-23 | Disposition: A | Discharge status: Home / Self Care | Payer: BC Managed Care – PPO | Attending: Emergency Medicine | Admitting: Emergency Medicine

## 2020-11-23 ENCOUNTER — Other Ambulatory Visit: Payer: Self-pay

## 2020-11-23 ENCOUNTER — Encounter (HOSPITAL_COMMUNITY): Payer: Self-pay

## 2020-11-23 DIAGNOSIS — Z91040 Latex allergy status: Secondary | ICD-10-CM | POA: Diagnosis not present

## 2020-11-23 DIAGNOSIS — K5903 Drug induced constipation: Secondary | ICD-10-CM | POA: Insufficient documentation

## 2020-11-23 DIAGNOSIS — K5900 Constipation, unspecified: Secondary | ICD-10-CM | POA: Diagnosis present

## 2020-11-23 MED ORDER — SORBITOL 70 % SOLN
400.0000 mL | TOPICAL_OIL | Freq: Once | ORAL | Status: AC
  Administered 2020-11-23: 400 mL via RECTAL
  Filled 2020-11-23: qty 120

## 2020-11-23 NOTE — ED Notes (Signed)
Pt had xl results from enema

## 2020-11-23 NOTE — Discharge Instructions (Signed)
Take Miralax 2 times daily and continue to take your colace. You may also try [employer] of Magnesia and take 15-30 ml before bed which should liquify your stool. If you are still unable to pass stool, take a Dulcolax suppository which has a stimulant in it. Be sure you are drinking plenty of water.  There is also as cereal on [employer] like a [employer] manager. It provides a huge amount of fiber and can make your stools soft and bulky. I would HIGHLY recommend the cinnamon flavor. This may help keep you regular with less medications.  Get help right away if: You have a fever and your symptoms suddenly get worse. You leak stool or have blood in your stool. Your abdomen is bloated. You have severe pain in your abdomen. You feel dizzy or you faint.

## 2020-11-23 NOTE — ED Provider Notes (Signed)
[facility] EMERGENCY DEPARTMENT Provider Note   CSN: [id number] Arrival date & time: 11/23/20  1925     History Chief Complaint  Patient presents with   Constipation    Amber Richard is a 35 y.o. female who presents with constipation. She is s/p cesarean section on 11/19/20. She has been unable to make a BM for the past 5 days. She has been taking oxycodone for pain. She has been using Colace, miralax, and tried a glycerine suppository without relief of her sxs. She feels rectal urgency and fullness.  She has been unable to strain make a bowel movement because because of her surgical incision compounding her issue.   Constipation     Past Medical History:  Diagnosis Date   Hx of acute pyelonephritis    Medical history non-contributory    PONV (postoperative nausea and vomiting)     Patient Active Problem List   Diagnosis Date Noted   Previous cesarean section 11/19/2020   History of recurrent UTIs 12/08/2019   Depression, recurrent (HCC) 04/09/2017   Generalized anxiety disorder 04/09/2017   Normal labor 11/15/2016    Past Surgical History:  Procedure Laterality Date   BREAST ENHANCEMENT SURGERY     CESAREAN SECTION N/A 11/16/2016   Procedure: CESAREAN SECTION;  Surgeon: Jaymes Graff, MD;  Location: [facility];  Service: Obstetrics;  Laterality: N/A;   CESAREAN SECTION N/A 11/19/2020   Procedure: REPEAT CESAREAN SECTION EDC: 11-24-20  ALLERG: LATEX  PREVIOUS X 1;  Surgeon: Mitchel Honour, DO;  Location: [facility] LD ORS;  Service: Obstetrics;  Laterality: N/A;   WISDOM TOOTH EXTRACTION       OB History     Gravida  2   Para  2   Term      Preterm      AB      Living  1      SAB      IAB      Ectopic      Multiple  0   Live Births  1           Family History  Problem Relation Age of Onset   Asthma Mother    Depression Maternal Grandmother    Heart disease Maternal Grandfather    Hypertension Maternal Grandfather    Diabetes  Maternal Grandfather    Colon cancer Maternal Grandfather     Social History   Tobacco Use   Smoking status: Never   Smokeless tobacco: Never  Vaping Use   Vaping Use: Never used  Substance Use Topics   Alcohol use: No   Drug use: No    Home Medications Prior to Admission medications   Medication Sig Start Date End Date Taking? Authorizing Provider  Bioflavonoid Products (VITAMIN C) CHEW Chew 2 tablets by mouth daily.    [provider]  docusate sodium (COLACE) 100 MG capsule Take 2 capsules (200 mg total) by mouth 2 (two) times daily. 11/21/20   Ranae Pila, MD  ferrous sulfate 325 (65 FE) MG EC tablet Take 1 tablet (325 mg total) by mouth 2 (two) times daily. 11/21/20   Ranae Pila, MD  FOLIC ACID PO Take 1 tablet by mouth daily.    [provider]  ibuprofen (ADVIL) 600 MG tablet Take 1 tablet (600 mg total) by mouth every 6 (six) hours as needed. 11/21/20   Ranae Pila, MD  oxyCODONE (OXY IR/ROXICODONE) 5 MG immediate release tablet Take 1 tablet (5 mg total) by  mouth every 4 (four) hours as needed for severe pain. 11/21/20   Ranae Pila, MD  Prenatal MV & Min w/FA-DHA (PRENATAL GUMMIES PO) Take 2 capsules by mouth daily.    [provider]    Allergies    Latex  Review of Systems   Review of Systems  Gastrointestinal:  Positive for constipation.  Skin:  Positive for wound.  Ten systems reviewed and are negative for acute change, except as noted in the HPI.   Physical Exam Updated Vital Signs BP 106/62   Pulse 75   Temp 98.1 F (36.7 C)   Resp 18   Ht 5\' 6"  (1.676 m)   Wt 70.8 kg   SpO2 99%   Breastfeeding Yes   BMI 25.18 kg/m   Physical Exam Vitals and nursing note reviewed.  Constitutional:      General: She is not in acute distress.    Appearance: She is well-developed. She is not diaphoretic.  HENT:     Head: Normocephalic and atraumatic.     Right Ear: External ear normal.     Left Ear:  External ear normal.     Nose: Nose normal.     Mouth/Throat:     Mouth: Mucous membranes are moist.  Eyes:     General: No scleral icterus.    Conjunctiva/sclera: Conjunctivae normal.  Cardiovascular:     Rate and Rhythm: Normal rate and regular rhythm.     Heart sounds: Normal heart sounds. No murmur heard.   No friction rub. No gallop.  Pulmonary:     Effort: Pulmonary effort is normal. No respiratory distress.     Breath sounds: Normal breath sounds.  Abdominal:     General: Bowel sounds are normal. There is no distension.     Palpations: Abdomen is soft. There is no mass.     Tenderness: There is no abdominal tenderness. There is no guarding.  Genitourinary:    Comments: Digital Rectal Exam reveals sphincter with good tone. No external hemorrhoids. No masses or fissures.  Firm, claylike stool ball felt at the very distal tip of examining finger. Musculoskeletal:     Cervical back: Normal range of motion.  Skin:    General: Skin is warm and dry.  Neurological:     Mental Status: She is alert and oriented to person, place, and time.  Psychiatric:        Behavior: Behavior normal.    ED Results / Procedures / Treatments   Labs (all labs ordered are listed, but only abnormal results are displayed) Labs Reviewed - No data to display  EKG None  Radiology No results found.  Procedures Procedures   Medications Ordered in ED Medications  sorbitol, milk of mag, mineral oil, glycerin (SMOG) enema (400 mLs Rectal Given 11/23/20 2305)    ED Course  I have reviewed the triage vital signs and the nursing notes.  Pertinent labs & imaging results that were available during my care of the patient were reviewed by me and considered in my medical decision making (see chart for details).    MDM Rules/Calculators/A&P                          35 year old female here with constipation after recent C-section.  Patient given smog enema with complete relief of her symptoms.  She is  feeling much better.  Advised on bowel regimen.  She appears otherwise appropriate for discharge at this time.  No  evidence of bowel obstruction.  No evidence of fecal impaction. Final Clinical Impression(s) / ED Diagnoses Final diagnoses:  Drug-induced constipation    Rx / DC Orders ED Discharge Orders     None        Arthor Captain, PA-C 11/24/20 2314    Derwood Kaplan, MD 11/25/20 1345

## 2020-11-23 NOTE — ED Triage Notes (Signed)
Pt. States they had a C-section on Monday. Pt. Has complaints of constipation. Pt. States they haven't had a bowel movement since Sunday.

## 2020-11-30 ENCOUNTER — Telehealth (HOSPITAL_COMMUNITY): Payer: Self-pay | Admitting: *Deleted

## 2020-11-30 NOTE — Telephone Encounter (Signed)
Mom reports feeling a bit stressed as older child has RSV and newborn was exposed. Incision healing well. EPDS=8 (hospital score=10). Discussed calling OB if anxiety worsens or doesn't lift. Mom agrees. Mom reports baby seems to be doing well, but she's watching carefully since RSV exposure. Peds called for guidance. Baby is feeding, peeing, and pooping without difficulty. Sleeps on back in crib. Reviewed safe sleep.  Duffy Rhody, RN 11-30-2020 at 11:26am

## 2021-01-25 ENCOUNTER — Telehealth: Payer: BC Managed Care – PPO | Admitting: Nurse Practitioner

## 2021-01-25 DIAGNOSIS — J029 Acute pharyngitis, unspecified: Secondary | ICD-10-CM

## 2021-01-25 MED ORDER — PREDNISONE 20 MG PO TABS: 40.0000 mg | ORAL_TABLET | Freq: Every day | ORAL | 0 refills | Status: DC

## 2021-01-25 NOTE — Patient Instructions (Signed)
Force fluids °Motrin or tylenol OTC °OTC decongestant °Throat lozenges if help °New toothbrush in 3 days ° °

## 2021-01-25 NOTE — Progress Notes (Signed)
Virtual Visit Consent   Amber Richard, you are scheduled for a virtual visit with Mary-Margaret Daphine Deutscher, FNP, a [facility] provider, today.     Just as with appointments in the office, your consent must be obtained to participate.  Your consent will be active for this visit and any virtual visit you may have with one of our providers in the next 365 days.     If you have a MyChart account, a copy of this consent can be sent to you electronically.  All virtual visits are billed to your insurance company just like a traditional visit in the office.    As this is a virtual visit, video technology does not allow for your provider to perform a traditional examination.  This may limit your provider's ability to fully assess your condition.  If your provider identifies any concerns that need to be evaluated in person or the need to arrange testing (such as labs, EKG, etc.), we will make arrangements to do so.     Although advances in technology are sophisticated, we cannot ensure that it will always work on either your end or our end.  If the connection with a video visit is poor, the visit may have to be switched to a telephone visit.  With either a video or telephone visit, we are not always able to ensure that we have a secure connection.     I need to obtain your verbal consent now.   Are you willing to proceed with your visit today? YES   Melany Bodkins has provided verbal consent on 01/25/2021 for a virtual visit (video or telephone).   Mary-Margaret Daphine Deutscher, FNP   Date: 01/25/2021 9:21 AM   Virtual Visit via Video Note   I, Mary-Margaret Nichola Cieslinski, connected with Amber Richard ([id number], January 03, 1986) on 01/25/21 at  9:30 AM EST by a video-enabled telemedicine application and verified that I am speaking with the correct person using two identifiers.  Location: Patient: Virtual Visit Location Patient: Home Provider: Virtual Visit Location Provider: Mobile   I discussed the  limitations of evaluation and management by telemedicine and the availability of in person appointments. The patient expressed understanding and agreed to proceed.    History of Present Illness: Amber Richard is a 35 y.o. who identifies as a female who was assigned female at birth, and is being seen today for sore throat.  HPI: Patient c/o sore throat for 3 days. She has fever. Was sweating last night and fever is good right now. Slight headache. Cough and sneezing.    Review of Systems  Constitutional:  Positive for fever and malaise/fatigue. Negative for chills.  HENT:  Positive for congestion and sore throat.   Respiratory:  Positive for cough (slight).   Musculoskeletal:  Negative for myalgias.  Neurological:  Positive for headaches. Negative for dizziness.   Problems:  Patient Active Problem List   Diagnosis Date Noted   Previous cesarean section 11/19/2020   History of recurrent UTIs 12/08/2019   Depression, recurrent (HCC) 04/09/2017   Generalized anxiety disorder 04/09/2017   Normal labor 11/15/2016    Allergies:  Allergies  Allergen Reactions   Latex Rash   Medications:  Current Outpatient Medications:    Bioflavonoid Products (VITAMIN C) CHEW, Chew 2 tablets by mouth daily., Disp: , Rfl:    docusate sodium (COLACE) 100 MG capsule, Take 2 capsules (200 mg total) by mouth 2 (two) times daily., Disp: 60 capsule, Rfl: 0   ferrous sulfate 325 (65 FE) MG  EC tablet, Take 1 tablet (325 mg total) by mouth 2 (two) times daily., Disp: 60 tablet, Rfl: 2   FOLIC ACID PO, Take 1 tablet by mouth daily., Disp: , Rfl:    ibuprofen (ADVIL) 600 MG tablet, Take 1 tablet (600 mg total) by mouth every 6 (six) hours as needed., Disp: 30 tablet, Rfl: 0   oxyCODONE (OXY IR/ROXICODONE) 5 MG immediate release tablet, Take 1 tablet (5 mg total) by mouth every 4 (four) hours as needed for severe pain., Disp: 15 tablet, Rfl: 0   Prenatal MV & Min w/FA-DHA (PRENATAL GUMMIES PO), Take 2 capsules by  mouth daily., Disp: , Rfl:   Observations/Objective: Patient is well-developed, well-nourished in no acute distress.  Resting comfortably  at home.  Head is normocephalic, atraumatic.  No labored breathing.  Speech is clear and coherent with logical content.  Patient is alert and oriented at baseline.  Voice hoarse  Assessment and Plan:  Janete Quilling in today with chief complaint of No chief complaint on file.   1. Viral pharyngitis Force fluids Motrin or tylenol OTC OTC decongestant Throat lozenges if help New toothbrush in 3 days    Follow Up Instructions: I discussed the assessment and treatment plan with the patient. The patient was provided an opportunity to ask questions and all were answered. The patient agreed with the plan and demonstrated an understanding of the instructions.  A copy of instructions were sent to the patient via MyChart.  The patient was advised to call back or seek an in-person evaluation if the symptoms worsen or if the condition fails to improve as anticipated.  Time:  I spent 10 minutes with the patient via telehealth technology discussing the above problems/concerns.    Mary-Margaret Daphine Deutscher, FNP

## 2021-01-28 ENCOUNTER — Other Ambulatory Visit: Payer: Self-pay

## 2021-01-28 ENCOUNTER — Ambulatory Visit (INDEPENDENT_AMBULATORY_CARE_PROVIDER_SITE_OTHER): Payer: BC Managed Care – PPO | Admitting: Family Medicine

## 2021-01-28 DIAGNOSIS — J988 Other specified respiratory disorders: Secondary | ICD-10-CM | POA: Diagnosis not present

## 2021-01-28 MED ORDER — AMOXICILLIN-POT CLAVULANATE 875-125 MG PO TABS: 1.0000 | ORAL_TABLET | Freq: Two times a day (BID) | ORAL | 0 refills | Status: DC

## 2021-01-28 MED ORDER — BENZONATATE 100 MG PO CAPS: 100.0000 mg | ORAL_CAPSULE | Freq: Three times a day (TID) | ORAL | 0 refills | Status: DC | PRN

## 2021-01-28 NOTE — Patient Instructions (Signed)
Rest.  Lots of fluids and voice rest.  If symptoms persist, start the antibiotic.  Stop the prednisone.  Take care  Dr. Adriana Simas

## 2021-01-28 NOTE — Assessment & Plan Note (Signed)
Likely viral in origin.  Advised supportive care and voice rest.  Tessalon Perles as needed for cough.  If symptoms persist, she can start the Augmentin.

## 2021-01-28 NOTE — Progress Notes (Signed)
Subjective:  Patient ID: Amber Richard, female    DOB: 1985-11-14  Age: 35 y.o. MRN: [id number]  CC: Chief Complaint  Patient presents with   Cough    Congestion since Thursday- dark thick congestion and blood in it -Virtual visit Friday and given prednisone    HPI:  35 year old female presents with respiratory symptoms.  Patient's symptoms started on Thursday.  She reports severe pharyngitis.  She had a virtual visit on 12/2.  She was prescribed prednisone.  This was thought to be viral in origin.  Patient has lost her voice.  She also has now experiencing productive cough and runny nose.  She states that the cough is productive of discolored sputum.  No documented fever.  She states that she did feel very hot and sweaty the first night when her symptoms started.  She has had no relief with the prednisone.  She states that she is having adverse side effects and would like to stop.  She would like to discuss this today.  Patient Active Problem List   Diagnosis Date Noted   Respiratory infection 01/28/2021   Previous cesarean section 11/19/2020   History of recurrent UTIs 12/08/2019   Depression, recurrent (HCC) 04/09/2017   Generalized anxiety disorder 04/09/2017    Social Hx   Social History   Socioeconomic History   Marital status: Married    Spouse name: Not on file   Number of children: Not on file   Years of education: Not on file   Highest education level: Not on file  Occupational History   Not on file  Tobacco Use   Smoking status: Never   Smokeless tobacco: Never  Vaping Use   Vaping Use: Never used  Substance and Sexual Activity   Alcohol use: No   Drug use: No   Sexual activity: Yes    Birth control/protection: None  Other Topics Concern   Not on file  Social History Narrative   Not on file   Social Determinants of Health   Financial Resource Strain: Not on file  Food Insecurity: Not on file  Transportation Needs: Not on file  Physical  Activity: Not on file  Stress: Not on file  Social Connections: Not on file    Review of Systems Per HPI  Objective:  BP 118/76   Pulse 89   Wt 134 lb 12.8 oz (61.1 kg)   SpO2 99%   BMI 21.76 kg/m   BP/Weight 01/28/2021 11/23/2020 11/21/2020  Systolic BP 118 106 98  Diastolic BP 76 62 69  Wt. (Lbs) 134.8 156 -  BMI 21.76 25.18 -    Physical Exam Constitutional:      General: She is not in acute distress.    Appearance: Normal appearance. She is not ill-appearing.  HENT:     Head: Normocephalic and atraumatic.     Right Ear: Tympanic membrane normal.     Left Ear: Tympanic membrane normal.     Mouth/Throat:     Pharynx: Posterior oropharyngeal erythema present. No oropharyngeal exudate.  Eyes:     General:        Right eye: No discharge.        Left eye: No discharge.     Conjunctiva/sclera: Conjunctivae normal.  Cardiovascular:     Rate and Rhythm: Normal rate and regular rhythm.     Heart sounds: No murmur heard. Pulmonary:     Effort: Pulmonary effort is normal.     Breath sounds: Normal breath sounds. No wheezing,  rhonchi or rales.  Neurological:     Mental Status: She is alert.    Lab Results  Component Value Date   WBC 8.0 11/20/2020   HGB 8.7 (L) 11/20/2020   HCT 26.5 (L) 11/20/2020   PLT 118 (L) 11/20/2020   GLUCOSE 84 08/01/2019   ALT 13 08/01/2019   AST 22 08/01/2019   NA 141 08/01/2019   K 4.5 08/01/2019   CL 101 08/01/2019   CREATININE 0.63 08/01/2019   BUN 14 08/01/2019   CO2 27 08/01/2019   INR 1.0 08/01/2019     Assessment & Plan:   Problem List Items Addressed This Visit       Respiratory   Respiratory infection    Likely viral in origin.  Advised supportive care and voice rest.  Tessalon Perles as needed for cough.  If symptoms persist, she can start the Augmentin.       Meds ordered this encounter  Medications   amoxicillin-clavulanate (AUGMENTIN) 875-125 MG tablet    Sig: Take 1 tablet by mouth 2 (two) times daily.     Dispense:  14 tablet    Refill:  0   benzonatate (TESSALON) 100 MG capsule    Sig: Take 1 capsule (100 mg total) by mouth 3 (three) times daily as needed for cough.    Dispense:  30 capsule    Refill:  0    Follow-up:  PRN  Everlene Other DO [facility]

## 2021-04-11 ENCOUNTER — Other Ambulatory Visit: Payer: Self-pay

## 2021-04-11 ENCOUNTER — Ambulatory Visit: Payer: BC Managed Care – PPO | Admitting: Family Medicine

## 2021-04-11 ENCOUNTER — Telehealth: Payer: Self-pay | Admitting: Family Medicine

## 2021-04-11 ENCOUNTER — Encounter: Payer: Self-pay | Admitting: Family Medicine

## 2021-04-11 VITALS — BP 110/70 | HR 122 | Temp 98.0°F | Wt 126.6 lb

## 2021-04-11 DIAGNOSIS — J111 Influenza due to unidentified influenza virus with other respiratory manifestations: Secondary | ICD-10-CM | POA: Diagnosis not present

## 2021-04-11 MED ORDER — OSELTAMIVIR PHOSPHATE 75 MG PO CAPS: 75.0000 mg | ORAL_CAPSULE | Freq: Two times a day (BID) | ORAL | 0 refills | Status: AC

## 2021-04-11 MED ORDER — ONDANSETRON 4 MG PO TBDP: 4.0000 mg | ORAL_TABLET | Freq: Three times a day (TID) | ORAL | 0 refills | Status: DC | PRN

## 2021-04-11 NOTE — Assessment & Plan Note (Signed)
Blood pressure initially low.  Improved on recheck.  She is tolerating fluids.  Concern for possible influenza.  Empiric Tamiflu.  Zofran as needed for nausea and vomiting.  Should help diarrhea as well.  Push fluids.  Supportive care.  Work note given.  If she fails to improve or worsens, she is to go to the hospital.

## 2021-04-11 NOTE — Progress Notes (Signed)
Subjective:  Patient ID: Amber Richard, female    DOB: 12-11-1985  Age: 36 y.o. MRN: [id number]  CC: Chief Complaint  Patient presents with   Fatigue    Pt began having congestion and runny nose for 3 days; fatigue, fever,chills, vomiting x2, diarrhea since midnight. Headache, runny nose, sneezing Drinking water and taking Cold/Flu; sipping on sprite and drinking water    HPI:  35 year old female presents with the above complaints.  Patient reports that over the past 3 days she has had congestion, runny nose, headache, and sneezing.  She has had some fatigue as well.  Over the past 24 hours has developed nausea, vomiting, diarrhea.  Has been unable to keep very much down.  She states that she has vomited a twice and has had a few bouts of diarrhea.  She also reports associated fever, Tmax 21.9.  She has been taking Tylenol Cold and flu.  No reported sick contacts.  However, she is a Runner, broadcasting/film/video.  No relieving factors.  Patient Active Problem List   Diagnosis Date Noted   Influenza-like illness 04/11/2021   Previous cesarean section 11/19/2020   History of recurrent UTIs 12/08/2019   Depression, recurrent (HCC) 04/09/2017   Generalized anxiety disorder 04/09/2017    Social Hx   Social History   Socioeconomic History   Marital status: Married    Spouse name: Not on file   Number of children: Not on file   Years of education: Not on file   Highest education level: Not on file  Occupational History   Not on file  Tobacco Use   Smoking status: Never   Smokeless tobacco: Never  Vaping Use   Vaping Use: Never used  Substance and Sexual Activity   Alcohol use: No   Drug use: No   Sexual activity: Yes    Birth control/protection: None  Other Topics Concern   Not on file  Social History Narrative   Not on file   Social Determinants of Health   Financial Resource Strain: Not on file  Food Insecurity: Not on file  Transportation Needs: Not on file  Physical Activity: Not  on file  Stress: Not on file  Social Connections: Not on file    Review of Systems Per HPI  Objective:  BP 110/70    Pulse (!) 122    Temp 98 F (36.7 C)    Wt 126 lb 9.6 oz (57.4 kg)    SpO2 94%    BMI 20.43 kg/m   BP/Weight 04/11/2021 01/28/2021 11/23/2020  Systolic BP 110 118 106  Diastolic BP 70 76 62  Wt. (Lbs) 126.6 134.8 156  BMI 20.43 21.76 25.18    Physical Exam Constitutional:      Comments: Appears mildly ill.  HENT:     Head: Normocephalic and atraumatic.     Mouth/Throat:     Mouth: Mucous membranes are moist.     Pharynx: Posterior oropharyngeal erythema present.  Eyes:     General:        Right eye: No discharge.        Left eye: No discharge.     Conjunctiva/sclera: Conjunctivae normal.  Cardiovascular:     Rate and Rhythm: Regular rhythm. Tachycardia present.  Pulmonary:     Effort: Pulmonary effort is normal.     Breath sounds: Normal breath sounds. No wheezing, rhonchi or rales.  Abdominal:     Palpations: Abdomen is soft.     Comments: Nondistended.  Diffusely tender to palpation.  Neurological:     Mental Status: She is alert.    Lab Results  Component Value Date   WBC 8.0 11/20/2020   HGB 8.7 (L) 11/20/2020   HCT 26.5 (L) 11/20/2020   PLT 118 (L) 11/20/2020   GLUCOSE 84 08/01/2019   ALT 13 08/01/2019   AST 22 08/01/2019   NA 141 08/01/2019   K 4.5 08/01/2019   CL 101 08/01/2019   CREATININE 0.63 08/01/2019   BUN 14 08/01/2019   CO2 27 08/01/2019   INR 1.0 08/01/2019     Assessment & Plan:   Problem List Items Addressed This Visit       Other   Influenza-like illness - Primary    Blood pressure initially low.  Improved on recheck.  She is tolerating fluids.  Concern for possible influenza.  Empiric Tamiflu.  Zofran as needed for nausea and vomiting.  Should help diarrhea as well.  Push fluids.  Supportive care.  Work note given.  If she fails to improve or worsens, she is to go to the hospital.       Meds ordered this  encounter  Medications   ondansetron (ZOFRAN-ODT) 4 MG disintegrating tablet    Sig: Take 1 tablet (4 mg total) by mouth every 8 (eight) hours as needed for nausea or vomiting.    Dispense:  20 tablet    Refill:  0   oseltamivir (TAMIFLU) 75 MG capsule    Sig: Take 1 capsule (75 mg total) by mouth 2 (two) times daily for 5 days.    Dispense:  10 capsule    Refill:  0    Brynlei Klausner DO [facility]

## 2021-04-11 NOTE — Telephone Encounter (Signed)
Ms. hanadi, stanly are scheduled for a virtual visit with your provider today.    Just as we do with appointments in the office, we must obtain your consent to participate.  Your consent will be active for this visit and any virtual visit you may have with one of our providers in the next 365 days.    If you have a MyChart account, I can also send a copy of this consent to you electronically.  All virtual visits are billed to your insurance company just like a traditional visit in the office.  As this is a virtual visit, video technology does not allow for your provider to perform a traditional examination.  This may limit your provider's ability to fully assess your condition.  If your provider identifies any concerns that need to be evaluated in person or the need to arrange testing such as labs, EKG, etc, we will make arrangements to do so.    Although advances in technology are sophisticated, we cannot ensure that it will always work on either your end or our end.  If the connection with a video visit is poor, we may have to switch to a telephone visit.  With either a video or telephone visit, we are not always able to ensure that we have a secure connection.   I need to obtain your verbal consent now.   Are you willing to proceed with your visit today?   Dima Wambolt has provided verbal consent on 04/11/2021 for a virtual visit (video or telephone).   Marlowe Shores, LPN 8/54/6270  3:50 AM

## 2021-04-11 NOTE — Patient Instructions (Signed)
Medication as prescribed.  Lots of fluids and rest.  If you worsen, go to the ER.  Take care  Dr. Lacinda Axon

## 2021-04-15 ENCOUNTER — Telehealth: Payer: Self-pay | Admitting: Family Medicine

## 2021-04-15 ENCOUNTER — Encounter: Payer: Self-pay | Admitting: Family Medicine

## 2021-04-15 DIAGNOSIS — R110 Nausea: Secondary | ICD-10-CM

## 2021-04-15 NOTE — Telephone Encounter (Signed)
Blood work ordered in [employer]. Patient notified and will have blood work completed and pick up work excuse

## 2021-04-15 NOTE — Telephone Encounter (Addendum)
Tommie Sams, DO    Okay to give extended work note. Please order labs - CBC, CMP w/ GFR, Lipase.

## 2021-04-15 NOTE — Telephone Encounter (Signed)
Patient wanting an extended work note still feeling nausea and fatigue.   She states has loss of appetite and requesting labs. Please advise

## 2021-04-16 LAB — CBC WITH DIFFERENTIAL/PLATELET
Basophils Absolute: 0 10*3/uL (ref 0.0–0.2)
Basos: 0 %
EOS (ABSOLUTE): 0.1 10*3/uL (ref 0.0–0.4)
Eos: 1 %
Hematocrit: 37.1 % (ref 34.0–46.6)
Hemoglobin: 12.5 g/dL (ref 11.1–15.9)
Immature Grans (Abs): 0 10*3/uL (ref 0.0–0.1)
Immature Granulocytes: 0 %
Lymphocytes Absolute: 1.2 10*3/uL (ref 0.7–3.1)
Lymphs: 27 %
MCH: 28.3 pg (ref 26.6–33.0)
MCHC: 33.7 g/dL (ref 31.5–35.7)
MCV: 84 fL (ref 79–97)
Monocytes Absolute: 0.3 10*3/uL (ref 0.1–0.9)
Monocytes: 7 %
Neutrophils Absolute: 3 10*3/uL (ref 1.4–7.0)
Neutrophils: 65 %
Platelets: 153 10*3/uL (ref 150–450)
RBC: 4.41 x10E6/uL (ref 3.77–5.28)
RDW: 13.7 % (ref 11.7–15.4)
WBC: 4.5 10*3/uL (ref 3.4–10.8)

## 2021-04-16 LAB — COMPREHENSIVE METABOLIC PANEL
ALT: 23 IU/L (ref 0–32)
AST: 28 IU/L (ref 0–40)
Albumin/Globulin Ratio: 1.9 (ref 1.2–2.2)
Albumin: 4.3 g/dL (ref 3.8–4.8)
Alkaline Phosphatase: 61 IU/L (ref 44–121)
BUN/Creatinine Ratio: 15 (ref 9–23)
BUN: 11 mg/dL (ref 6–20)
Bilirubin Total: 0.3 mg/dL (ref 0.0–1.2)
CO2: 26 mmol/L (ref 20–29)
Calcium: 8.8 mg/dL (ref 8.7–10.2)
Chloride: 103 mmol/L (ref 96–106)
Creatinine, Ser: 0.72 mg/dL (ref 0.57–1.00)
Globulin, Total: 2.3 g/dL (ref 1.5–4.5)
Glucose: 82 mg/dL (ref 70–99)
Potassium: 3.9 mmol/L (ref 3.5–5.2)
Sodium: 144 mmol/L (ref 134–144)
Total Protein: 6.6 g/dL (ref 6.0–8.5)
eGFR: 112 mL/min/{1.73_m2} (ref >59)

## 2021-04-16 LAB — LIPASE: Lipase: 38 U/L (ref 14–72)

## 2021-05-15 ENCOUNTER — Ambulatory Visit (INDEPENDENT_AMBULATORY_CARE_PROVIDER_SITE_OTHER): Payer: BC Managed Care – PPO | Admitting: Family Medicine

## 2021-05-15 ENCOUNTER — Encounter: Payer: Self-pay | Admitting: Family Medicine

## 2021-05-15 ENCOUNTER — Telehealth: Payer: Self-pay | Admitting: Family Medicine

## 2021-05-15 ENCOUNTER — Other Ambulatory Visit: Payer: Self-pay

## 2021-05-15 VITALS — BP 109/71 | HR 96 | Temp 99.2°F | Wt 125.4 lb

## 2021-05-15 DIAGNOSIS — J029 Acute pharyngitis, unspecified: Secondary | ICD-10-CM | POA: Diagnosis not present

## 2021-05-15 LAB — POCT RAPID STREP A (OFFICE): Rapid Strep A Screen: NEGATIVE

## 2021-05-15 NOTE — Assessment & Plan Note (Signed)
Rapid strep negative.  Suspect that this is viral in origin.  However, her daughter has similar symptoms.  I have been seeing a lot of strep throat.  I advised her that I have sent it for culture.  Advised supportive care with Tylenol and ibuprofen. ?

## 2021-05-15 NOTE — Telephone Encounter (Signed)
Work note created and pt is aware. Work note sent through [employer] ?

## 2021-05-15 NOTE — Patient Instructions (Signed)
Likely viral.  ? ?Rest and lots of fluids.  Ibuprofen as needed. ? ?If your daughter test positive for strep throat, please let me know and I will go ahead and place you on antibiotics while awaiting your throat culture. ? ?Take care ? ?Dr. Adriana Simas  ?

## 2021-05-15 NOTE — Telephone Encounter (Signed)
Pt requesting work note for today. Pt wanting to know how long to be out. Please advise. Thank you.  ?

## 2021-05-15 NOTE — Progress Notes (Signed)
? ?Subjective:  ?Patient ID: Amber Richard, female    DOB: 11-06-1985  Age: 36 y.o. MRN: [id number] ? ?CC: ?Chief Complaint  ?Patient presents with  ? Sore Throat  ?  Patient did COVID test this morning and it was negative.  ? Generalized Body Aches  ? Nausea  ? ? ?HPI: ? ?36 year old female presents for evaluation of the above. ? ?Patient reports that she has had symptoms since yesterday.  She states that she has been preceding fatigue prior to yesterday but her symptoms really started yesterday.  She reports sore throat, chills, nausea, and body aches.  Daughter has similar symptoms.  No fever.  No relieving factors.  No other associated symptoms.  No other complaints. ? ?Patient Active Problem List  ? Diagnosis Date Noted  ? Viral pharyngitis 05/15/2021  ? Previous cesarean section 11/19/2020  ? History of recurrent UTIs 12/08/2019  ? Depression, recurrent (HCC) 04/09/2017  ? Generalized anxiety disorder 04/09/2017  ? ? ?Social Hx   ?Social History  ? ?Socioeconomic History  ? Marital status: Married  ?  Spouse name: Not on file  ? Number of children: Not on file  ? Years of education: Not on file  ? Highest education level: Not on file  ?Occupational History  ? Not on file  ?Tobacco Use  ? Smoking status: Never  ? Smokeless tobacco: Never  ?Vaping Use  ? Vaping Use: Never used  ?Substance and Sexual Activity  ? Alcohol use: No  ? Drug use: No  ? Sexual activity: Yes  ?  Birth control/protection: None  ?Other Topics Concern  ? Not on file  ?Social History Narrative  ? Not on file  ? ?Social Determinants of Health  ? ?Financial Resource Strain: Not on file  ?Food Insecurity: Not on file  ?Transportation Needs: Not on file  ?Physical Activity: Not on file  ?Stress: Not on file  ?Social Connections: Not on file  ? ? ?Review of Systems ?Per HPI ? ?Objective:  ?BP 109/71   Pulse 96   Temp 99.2 ?F (37.3 ?C) (Oral)   Wt 125 lb 6.4 oz (56.9 kg)   SpO2 98%   BMI 20.24 kg/m?  ? ? ?  05/15/2021  ? 11:22 AM 04/11/2021   ? 10:28 AM 01/28/2021  ? 11:33 AM  ?BP/Weight  ?Systolic BP 109 110 118  ?Diastolic BP 71 70 76  ?Wt. (Lbs) 125.4 126.6 134.8  ?BMI 20.24 kg/m2 20.43 kg/m2 21.76 kg/m2  ? ? ?Physical Exam ?Vitals and nursing note reviewed.  ?Constitutional:   ?   General: She is not in acute distress. ?   Appearance: Normal appearance.  ?HENT:  ?   Head: Normocephalic and atraumatic.  ?   Right Ear: Tympanic membrane normal.  ?   Left Ear: Tympanic membrane normal.  ?   Mouth/Throat:  ?   Pharynx: Posterior oropharyngeal erythema present. No oropharyngeal exudate.  ?Eyes:  ?   General:     ?   Right eye: No discharge.     ?   Left eye: No discharge.  ?   Conjunctiva/sclera: Conjunctivae normal.  ?Cardiovascular:  ?   Rate and Rhythm: Normal rate and regular rhythm.  ?Pulmonary:  ?   Effort: Pulmonary effort is normal.  ?   Breath sounds: Normal breath sounds. No wheezing, rhonchi or rales.  ?Neurological:  ?   Mental Status: She is alert.  ? ? ?Lab Results  ?Component Value Date  ? WBC 4.5 04/15/2021  ?  HGB 12.5 04/15/2021  ? HCT 37.1 04/15/2021  ? PLT 153 04/15/2021  ? GLUCOSE 82 04/15/2021  ? ALT 23 04/15/2021  ? AST 28 04/15/2021  ? NA 144 04/15/2021  ? K 3.9 04/15/2021  ? CL 103 04/15/2021  ? CREATININE 0.72 04/15/2021  ? BUN 11 04/15/2021  ? CO2 26 04/15/2021  ? INR 1.0 08/01/2019  ? ? ? ?Assessment & Plan:  ? ?Problem List Items Addressed This Visit   ? ?  ? Respiratory  ? Viral pharyngitis - Primary  ?  Rapid strep negative.  Suspect that this is viral in origin.  However, her daughter has similar symptoms.  I have been seeing a lot of strep throat.  I advised her that I have sent it for culture.  Advised supportive care with Tylenol and ibuprofen. ?  ?  ? Relevant Orders  ? POCT rapid strep A (Completed)  ? Culture, Group A Strep  ? ?Other Visit Diagnoses   ? ? Sore throat      ? Relevant Orders  ? Culture, Group A Strep  ? ?  ? ?Everlene Other DO ?[facility] ? ?

## 2021-05-18 LAB — CULTURE, GROUP A STREP: Strep A Culture: NEGATIVE

## 2021-07-02 ENCOUNTER — Ambulatory Visit (INDEPENDENT_AMBULATORY_CARE_PROVIDER_SITE_OTHER): Payer: BC Managed Care – PPO | Admitting: Family Medicine

## 2021-07-02 DIAGNOSIS — K644 Residual hemorrhoidal skin tags: Secondary | ICD-10-CM

## 2021-07-02 MED ORDER — DICLOFENAC SODIUM 75 MG PO TBEC: 75.0000 mg | DELAYED_RELEASE_TABLET | Freq: Two times a day (BID) | ORAL | 0 refills | Status: DC | PRN

## 2021-07-02 MED ORDER — HYDROCORTISONE (PERIANAL) 2.5 % EX CREA: 1.0000 "application " | TOPICAL_CREAM | Freq: Two times a day (BID) | CUTANEOUS | 0 refills | Status: DC

## 2021-07-02 NOTE — Patient Instructions (Signed)
Medication as prescribed. ? ?Colace and witch hazel as well.  ? ?Take care ? ?Dr. Lacinda Axon  ?

## 2021-07-03 DIAGNOSIS — K644 Residual hemorrhoidal skin tags: Secondary | ICD-10-CM | POA: Insufficient documentation

## 2021-07-03 NOTE — Assessment & Plan Note (Signed)
Treating with Anusol.  Advise use of Colace and witch hazel.  Diclofenac as needed for moderate pain. ?

## 2021-07-03 NOTE — Progress Notes (Signed)
? ?Subjective:  ?Patient ID: Amber Richard, female    DOB: [birth year]  Age: 36 y.o. MRN: [id number] ? ?CC: ?Chief Complaint  ?Patient presents with  ? Hemorrhoids  ?  Started noticing them in March and seems to be getting worse and painful and burning  ?Also woke up this morning feeling achy and congested  ? ? ?HPI: ? ?36 year old female presents for evaluation of the above. ? ?Patient reports that she has been having trouble with external hemorrhoids since March.  Has been worsening over the past week.  She reports rectal pain and itching.  She has been using topical over-the-counter medication without relief.  She has also been taking Tylenol without relief.  She states that she is not experiencing any significant constipation at this time.  She states that she has a soft bowel movement daily for the most part.  No abdominal pain.  She has had some rectal bleeding.  No other associated symptoms.  No other complaints. ? ?Patient Active Problem List  ? Diagnosis Date Noted  ? External hemorrhoid 07/03/2021  ? Previous cesarean section 11/19/2020  ? History of recurrent UTIs 12/08/2019  ? Depression, recurrent (HCC) 04/09/2017  ? Generalized anxiety disorder 04/09/2017  ? ? ?Social Hx   ?Social History  ? ?Socioeconomic History  ? Marital status: Married  ?  Spouse name: Not on file  ? Number of children: Not on file  ? Years of education: Not on file  ? Highest education level: Not on file  ?Occupational History  ? Not on file  ?Tobacco Use  ? Smoking status: Never  ? Smokeless tobacco: Never  ?Vaping Use  ? Vaping Use: Never used  ?Substance and Sexual Activity  ? Alcohol use: No  ? Drug use: No  ? Sexual activity: Yes  ?  Birth control/protection: None  ?Other Topics Concern  ? Not on file  ?Social History Narrative  ? Not on file  ? ?Social Determinants of Health  ? ?Financial Resource Strain: Not on file  ?Food Insecurity: Not on file  ?Transportation Needs: Not on file  ?Physical Activity: Not on file   ?Stress: Not on file  ?Social Connections: Not on file  ? ? ?Review of Systems ?Per HPI ? ?Objective:  ?BP 100/67   Ht 5\' 5"  (1.651 m)   Wt 123 lb (55.8 kg)   BMI 20.47 kg/m?  ? ? ?  07/02/2021  ?  3:56 PM 05/15/2021  ? 11:22 AM 04/11/2021  ? 10:28 AM  ?BP/Weight  ?Systolic BP 100 109 110  ?Diastolic BP 67 71 70  ?Wt. (Lbs) 123 125.4 126.6  ?BMI 20.47 kg/m2 20.24 kg/m2 20.43 kg/m2  ? ? ?Physical Exam ?Vitals and nursing note reviewed. Exam conducted with a chaperone present.  ?Constitutional:   ?   Appearance: Normal appearance.  ?HENT:  ?   Head: Normocephalic and atraumatic.  ?Eyes:  ?   General:     ?   Right eye: No discharge.     ?   Left eye: No discharge.  ?   Conjunctiva/sclera: Conjunctivae normal.  ?Pulmonary:  ?   Effort: Pulmonary effort is normal. No respiratory distress.  ?Genitourinary: ?   Rectum: External hemorrhoid present. No anal fissure.  ?   Comments: Patient has a single external hemorrhoid.  There is a small overlying ulceration. ?Neurological:  ?   Mental Status: She is alert.  ?Psychiatric:     ?   Mood and Affect: Mood normal.     ?  Behavior: Behavior normal.  ? ? ?Lab Results  ?Component Value Date  ? WBC 4.5 04/15/2021  ? HGB 12.5 04/15/2021  ? HCT 37.1 04/15/2021  ? PLT 153 04/15/2021  ? GLUCOSE 82 04/15/2021  ? ALT 23 04/15/2021  ? AST 28 04/15/2021  ? NA 144 04/15/2021  ? K 3.9 04/15/2021  ? CL 103 04/15/2021  ? CREATININE 0.72 04/15/2021  ? BUN 11 04/15/2021  ? CO2 26 04/15/2021  ? INR 1.0 08/01/2019  ? ? ? ?Assessment & Plan:  ? ?Problem List Items Addressed This Visit   ? ?  ? Cardiovascular and Mediastinum  ? External hemorrhoid  ?  Treating with Anusol.  Advise use of Colace and witch hazel.  Diclofenac as needed for moderate pain. ? ?  ?  ? ? ?Meds ordered this encounter  ?Medications  ? hydrocortisone (ANUSOL-HC) 2.5 % rectal cream  ?  Sig: Place 1 application. rectally 2 (two) times daily.  ?  Dispense:  30 g  ?  Refill:  0  ? diclofenac (VOLTAREN) 75 MG EC tablet  ?  Sig:  Take 1 tablet (75 mg total) by mouth 2 (two) times daily as needed for moderate pain.  ?  Dispense:  20 tablet  ?  Refill:  0  ? ?Everlene Other DO ?[facility] ? ?

## 2021-07-10 ENCOUNTER — Other Ambulatory Visit: Payer: Self-pay | Admitting: Family Medicine

## 2021-08-02 ENCOUNTER — Telehealth: Payer: Self-pay | Admitting: Family Medicine

## 2021-08-02 DIAGNOSIS — J302 Other seasonal allergic rhinitis: Secondary | ICD-10-CM

## 2021-08-02 MED ORDER — PREDNISONE 10 MG PO TABS: 10.0000 mg | ORAL_TABLET | Freq: Every day | ORAL | 0 refills | Status: AC

## 2021-08-02 NOTE — Progress Notes (Signed)
E visit for Allergic Rhinitis We are sorry that you are not feeling well.  Here is how we plan to help!  Based on what you have shared with me it looks like you have Allergic Rhinitis.  Rhinitis is when a reaction occurs that causes nasal congestion, runny nose, sneezing, and itching.  Most types of rhinitis are caused by an inflammation and are associated with symptoms in the eyes ears or throat. There are several types of rhinitis.  The most common are acute rhinitis, which is usually caused by a viral illness, allergic or seasonal rhinitis, and nonallergic or year-round rhinitis.  Nasal allergies occur certain times of the year.  Allergic rhinitis is caused when allergens in the air trigger the release of histamine in the body.  Histamine causes itching, swelling, and fluid to build up in the fragile linings of the nasal passages, sinuses and eyelids.  An itchy nose and clear discharge are common.  I recommend the following over the counter treatments: You should take a daily dose of antihistamine  I also would recommend a nasal spray: Flonase 2 sprays into each nostril once daily  Please start otc med today and I will send a prednisone pack to start treatments until antihistamine has time to be effective.   HOME CARE:  You can use an over-the-counter saline nasal spray as needed Avoid areas where there is heavy dust, mites, or molds Stay indoors on windy days during the pollen season Keep windows closed in home, at least in bedroom; use air conditioner. Use high-efficiency house air filter Keep windows closed in car, turn AC on re-circulate Avoid playing out with dog during pollen season  GET HELP RIGHT AWAY IF:  If your symptoms do not improve within 10 days You become short of breath You develop yellow or green discharge from your nose for over 3 days You have coughing fits  MAKE SURE YOU:  Understand these instructions Will watch your condition Will get help right away if  you are not doing well or get worse  Thank you for choosing an e-visit. Your e-visit answers were reviewed by a board certified advanced clinical practitioner to complete your personal care plan. Depending upon the condition, your plan could have included both over the counter or prescription medications. Please review your pharmacy choice. Be sure that the pharmacy you have chosen is open so that you can pick up your prescription now.  If there is a problem you may message your provider in MyChart to have the prescription routed to another pharmacy. Your safety is important to [place]. If you have drug allergies check your prescription carefully.  For the next 24 hours, you can use MyChart to ask questions about today's visit, request a non-urgent call back, or ask for a work or school excuse from your e-visit provider. You will get an email in the next two days asking about your experience. I hope that your e-visit has been valuable and will speed your recovery.  I have provided 5 minutes of non face to face time during this encounter for chart review and documentation.

## 2021-12-09 ENCOUNTER — Ambulatory Visit: Payer: BC Managed Care – PPO | Admitting: Nurse Practitioner

## 2021-12-09 ENCOUNTER — Encounter: Payer: Self-pay | Admitting: Nurse Practitioner

## 2021-12-09 VITALS — BP 112/68 | HR 64 | Temp 97.3°F | Resp 20 | Ht 65.0 in | Wt 132.0 lb

## 2021-12-09 DIAGNOSIS — R300 Dysuria: Secondary | ICD-10-CM

## 2021-12-09 LAB — POCT URINALYSIS DIP (CLINITEK)
Bilirubin, UA: NEGATIVE
Glucose, UA: NEGATIVE mg/dL
Ketones, POC UA: NEGATIVE mg/dL
Nitrite, UA: POSITIVE — AB
POC PROTEIN,UA: NEGATIVE
Spec Grav, UA: 1.01 (ref 1.010–1.025)
Urobilinogen, UA: NEGATIVE E.U./dL — AB
pH, UA: 6.5 (ref 5.0–8.0)

## 2021-12-09 MED ORDER — SULFAMETHOXAZOLE-TRIMETHOPRIM 800-160 MG PO TABS: 1.0000 | ORAL_TABLET | Freq: Two times a day (BID) | ORAL | 0 refills | Status: AC

## 2021-12-09 NOTE — Progress Notes (Signed)
You  Subjective:    Patient ID: Amber Richard, female    DOB: [birth year], 36 y.o.   MRN: [id number]  HPI Patient here today for possible UTI. She presents with lower abdominal pressure and dysuria. This started during the night.   Patient states that she had some leftover Keflex and she started taking 1 tablet this morning.  Patient denies any fevers, chills, body aches, back pain.   Review of Systems  Genitourinary:  Positive for dysuria.       Objective:   Physical Exam Vitals reviewed.  Constitutional:      General: She is not in acute distress.    Appearance: Normal appearance. She is normal weight. She is not ill-appearing, toxic-appearing or diaphoretic.  HENT:     Head: Normocephalic and atraumatic.  Cardiovascular:     Rate and Rhythm: Normal rate and regular rhythm.     Pulses: Normal pulses.     Heart sounds: Normal heart sounds. No murmur heard. Pulmonary:     Effort: Pulmonary effort is normal. No respiratory distress.     Breath sounds: Normal breath sounds. No wheezing.  Abdominal:     General: Abdomen is flat. Bowel sounds are normal. There is no distension.     Palpations: Abdomen is soft. There is no mass.     Tenderness: There is no abdominal tenderness. There is no right CVA tenderness, left CVA tenderness, guarding or rebound.     Hernia: No hernia is present.  Musculoskeletal:     Comments: Grossly intact  Skin:    General: Skin is warm.     Capillary Refill: Capillary refill takes less than 2 seconds.  Neurological:     Mental Status: She is alert.     Comments: Grossly intact  Psychiatric:        Mood and Affect: Mood normal.        Behavior: Behavior normal.           Assessment & Plan:   1. Dysuria -Likely UTI - POCT URINALYSIS DIP (CLINITEK) - Urine Culture, pending -We will start patient on Bactrim today - sulfamethoxazole-trimethoprim (BACTRIM DS) 800-160 MG tablet; Take 1 tablet by mouth 2 (two) times daily for 3 days.   Dispense: 6 tablet; Refill: 0 -Return to clinic if symptoms not better or worsen within 2 to 3 days    Note:  This document was prepared using Dragon voice recognition software and may include unintentional dictation errors. Note - This record has been created using [employer].  Chart creation errors have been sought, but may not always  have been located. Such creation errors do not reflect on  the standard of medical care.

## 2021-12-11 LAB — URINE CULTURE

## 2021-12-11 LAB — SPECIMEN STATUS REPORT

## 2021-12-23 ENCOUNTER — Ambulatory Visit (INDEPENDENT_AMBULATORY_CARE_PROVIDER_SITE_OTHER): Payer: BC Managed Care – PPO | Admitting: Family Medicine

## 2021-12-23 ENCOUNTER — Encounter: Payer: Self-pay | Admitting: Family Medicine

## 2021-12-23 DIAGNOSIS — J209 Acute bronchitis, unspecified: Secondary | ICD-10-CM

## 2021-12-23 MED ORDER — DOXYCYCLINE HYCLATE 100 MG PO TABS: 100.0000 mg | ORAL_TABLET | Freq: Two times a day (BID) | ORAL | 0 refills | Status: DC

## 2021-12-24 DIAGNOSIS — J209 Acute bronchitis, unspecified: Secondary | ICD-10-CM | POA: Insufficient documentation

## 2021-12-24 NOTE — Progress Notes (Signed)
Subjective:  Patient ID: Amber Richard, female    DOB: 1985/08/03  Age: 36 y.o. MRN: [id number]  CC: Chief Complaint  Patient presents with   Cough    Pt arrives with cough, stopped up nose, runny nose, sneezing, aches, some sweats at night. Pt states she is coughing up dark brown/dark yellow mucus.     HPI:  36 year old female presents for evaluation above.  Patient reports that she has been sick since Thursday.  She reports congestion, runny nose, sneezing, cough.  She states that her cough is quite productive of dark yellow sputum.  She has had some sweats.  No fever.  She feels very poorly.  She is a Education officer, museum.  She will be going out of town in the near future and is concerned.  She wants to be well so that she can go on her trip.   Patient Active Problem List   Diagnosis Date Noted   Acute bronchitis 12/24/2021   External hemorrhoid 07/03/2021   Previous cesarean section 11/19/2020   History of recurrent UTIs 12/08/2019   Depression, recurrent ([place]) 04/09/2017   Generalized anxiety disorder 04/09/2017    Social Hx   Social History   Socioeconomic History   Marital status: Married    Spouse name: Not on file   Number of children: Not on file   Years of education: Not on file   Highest education level: Not on file  Occupational History   Not on file  Tobacco Use   Smoking status: Never   Smokeless tobacco: Never  Vaping Use   Vaping Use: Never used  Substance and Sexual Activity   Alcohol use: No   Drug use: No   Sexual activity: Yes    Birth control/protection: None  Other Topics Concern   Not on file  Social History Narrative   Not on file   Social Determinants of Health   Financial Resource Strain: Not on file  Food Insecurity: Not on file  Transportation Needs: Not on file  Physical Activity: Not on file  Stress: Not on file  Social Connections: Not on file    Review of Systems Per HPI  Objective:  BP 104/67   Pulse 67   Temp 97.7  F (36.5 C)   Wt 131 lb 12.8 oz (59.8 kg)   LMP 08/09/2021 (Approximate)   SpO2 95%   BMI 21.93 kg/m      12/23/2021    4:17 PM 12/09/2021    9:56 AM 07/02/2021    3:56 PM  BP/Weight  Systolic BP 361 443 154  Diastolic BP 67 68 67  Wt. (Lbs) 131.8 132 123  BMI 21.93 kg/m2 21.97 kg/m2 20.47 kg/m2    Physical Exam Vitals and nursing note reviewed.  Constitutional:      General: She is not in acute distress.    Appearance: Normal appearance.  HENT:     Head: Normocephalic and atraumatic.     Right Ear: Tympanic membrane normal.     Left Ear: Tympanic membrane normal.     Nose: Congestion present.     Mouth/Throat:     Pharynx: Oropharynx is clear.  Cardiovascular:     Rate and Rhythm: Normal rate and regular rhythm.  Pulmonary:     Effort: Pulmonary effort is normal.     Breath sounds: No wheezing, rhonchi or rales.  Neurological:     Mental Status: She is alert.     Lab Results  Component Value Date   WBC  4.5 04/15/2021   HGB 12.5 04/15/2021   HCT 37.1 04/15/2021   PLT 153 04/15/2021   GLUCOSE 82 04/15/2021   ALT 23 04/15/2021   AST 28 04/15/2021   NA 144 04/15/2021   K 3.9 04/15/2021   CL 103 04/15/2021   CREATININE 0.72 04/15/2021   BUN 11 04/15/2021   CO2 26 04/15/2021   INR 1.0 08/01/2019     Assessment & Plan:   Problem List Items Addressed This Visit       Respiratory   Acute bronchitis    Treating with Doxycycline.        Meds ordered this encounter  Medications   doxycycline (VIBRA-TABS) 100 MG tablet    Sig: Take 1 tablet (100 mg total) by mouth 2 (two) times daily.    Dispense:  14 tablet    Refill:  0    Follow-up:  Return if symptoms worsen or fail to improve.  [name]

## 2021-12-24 NOTE — Assessment & Plan Note (Signed)
Treating with Doxycycline. ?

## 2022-01-01 ENCOUNTER — Encounter: Payer: Self-pay | Admitting: Family Medicine

## 2022-01-02 ENCOUNTER — Ambulatory Visit (HOSPITAL_COMMUNITY)
Admission: RE | Admit: 2022-01-02 | Discharge: 2022-01-02 | Disposition: A | Discharge status: Home / Self Care | Payer: BC Managed Care – PPO | Source: Ambulatory Visit | Attending: Family Medicine | Admitting: Family Medicine

## 2022-01-02 ENCOUNTER — Encounter: Payer: Self-pay | Admitting: Family Medicine

## 2022-01-02 ENCOUNTER — Ambulatory Visit (INDEPENDENT_AMBULATORY_CARE_PROVIDER_SITE_OTHER): Payer: BC Managed Care – PPO | Admitting: Family Medicine

## 2022-01-02 VITALS — BP 104/67 | HR 66 | Temp 97.4°F | Ht 65.0 in | Wt 134.0 lb

## 2022-01-02 DIAGNOSIS — R059 Cough, unspecified: Secondary | ICD-10-CM | POA: Diagnosis present

## 2022-01-02 MED ORDER — PREDNISONE 50 MG PO TABS: ORAL_TABLET | ORAL | 0 refills | Status: DC

## 2022-01-02 MED ORDER — PROMETHAZINE-DM 6.25-15 MG/5ML PO SYRP: 5.0000 mL | ORAL_SOLUTION | Freq: Four times a day (QID) | ORAL | 0 refills | Status: DC | PRN

## 2022-01-02 MED ORDER — BENZONATATE 200 MG PO CAPS: 200.0000 mg | ORAL_CAPSULE | Freq: Three times a day (TID) | ORAL | 0 refills | Status: DC | PRN

## 2022-01-02 NOTE — Patient Instructions (Signed)
Medications as prescribed.  Chest xray today. We will call with the results.   Take care  Dr. Adriana Simas

## 2022-01-03 DIAGNOSIS — R059 Cough, unspecified: Secondary | ICD-10-CM | POA: Insufficient documentation

## 2022-01-03 NOTE — Progress Notes (Signed)
Subjective:  Patient ID: Amber Richard, female    DOB: August 29, 1985  Age: 36 y.o. MRN: [id number]  CC: Chief Complaint  Patient presents with   chest congestion     Prod dark yellow mucous, right side ribs sore with breathing in or cough, comp abx     HPI:  36 year old female presents for evaluation the above.  Patient recently seen by me and treated for acute bronchitis on 10/30.  Patient reports that she continues to have productive cough.  She is having some lower rib pain as well.  No fever.  She is currently on antibiotic therapy still.  She is concerned given persistent symptoms.  She is also concerned that she has an upcoming trip.  Patient Active Problem List   Diagnosis Date Noted   Cough 01/03/2022   External hemorrhoid 07/03/2021   Previous cesarean section 11/19/2020   History of recurrent UTIs 12/08/2019   Depression, recurrent ([place]) 04/09/2017   Generalized anxiety disorder 04/09/2017    Social Hx   Social History   Socioeconomic History   Marital status: Married    Spouse name: Not on file   Number of children: Not on file   Years of education: Not on file   Highest education level: Not on file  Occupational History   Not on file  Tobacco Use   Smoking status: Never   Smokeless tobacco: Never  Vaping Use   Vaping Use: Never used  Substance and Sexual Activity   Alcohol use: No   Drug use: No   Sexual activity: Yes    Birth control/protection: None  Other Topics Concern   Not on file  Social History Narrative   Not on file   Social Determinants of Health   Financial Resource Strain: Not on file  Food Insecurity: Not on file  Transportation Needs: Not on file  Physical Activity: Not on file  Stress: Not on file  Social Connections: Not on file    Review of Systems Per HPI  Objective:  BP 104/67   Pulse 66   Temp (!) 97.4 F (36.3 C)   Ht 5\' 5"  (1.651 m)   Wt 134 lb (60.8 kg)   LMP 08/09/2021 (Approximate)   SpO2 99%   BMI  22.30 kg/m      01/02/2022    2:31 PM 12/23/2021    4:17 PM 12/09/2021    9:56 AM  BP/Weight  Systolic BP [id number]  Diastolic BP 67 67 68  Wt. (Lbs) 134 131.8 132  BMI 22.3 kg/m2 21.93 kg/m2 21.97 kg/m2    Physical Exam Vitals and nursing note reviewed.  Constitutional:      General: She is not in acute distress.    Appearance: Normal appearance. She is not ill-appearing.  HENT:     Head: Normocephalic and atraumatic.  Cardiovascular:     Rate and Rhythm: Normal rate and regular rhythm.  Pulmonary:     Effort: Pulmonary effort is normal.     Breath sounds: Normal breath sounds. No wheezing, rhonchi or rales.  Neurological:     Mental Status: She is alert.  Psychiatric:        Mood and Affect: Mood normal.        Behavior: Behavior normal.     Lab Results  Component Value Date   WBC 4.5 04/15/2021   HGB 12.5 04/15/2021   HCT 37.1 04/15/2021   PLT 153 04/15/2021   GLUCOSE 82 04/15/2021   ALT 23 04/15/2021  AST 28 04/15/2021   NA 144 04/15/2021   K 3.9 04/15/2021   CL 103 04/15/2021   CREATININE 0.72 04/15/2021   BUN 11 04/15/2021   CO2 26 04/15/2021   INR 1.0 08/01/2019     Assessment & Plan:   Problem List Items Addressed This Visit       Other   Cough - Primary    Persistent cough.  Chest x-ray was obtained and was independently reviewed by me.  Interpretation: Normal chest x-ray.  No evidence of pneumonia.  Treating with prednisone, Promethazine DM and Tessalon Perles.      Relevant Orders   DG Chest 2 View (Completed)    Meds ordered this encounter  Medications   predniSONE (DELTASONE) 50 MG tablet    Sig: 1 tablet daily x 5 days    Dispense:  5 tablet    Refill:  0   promethazine-dextromethorphan (PROMETHAZINE-DM) 6.25-15 MG/5ML syrup    Sig: Take 5 mLs by mouth 4 (four) times daily as needed for cough.    Dispense:  118 mL    Refill:  0   benzonatate (TESSALON) 200 MG capsule    Sig: Take 1 capsule (200 mg total) by mouth 3 (three)  times daily as needed for cough.    Dispense:  30 capsule    Refill:  0    Oveda Dadamo DO [facility]

## 2022-01-03 NOTE — Assessment & Plan Note (Signed)
Persistent cough.  Chest x-ray was obtained and was independently reviewed by me.  Interpretation: Normal chest x-ray.  No evidence of pneumonia.  Treating with prednisone, Promethazine DM and Tessalon Perles.

## 2022-03-25 ENCOUNTER — Ambulatory Visit (INDEPENDENT_AMBULATORY_CARE_PROVIDER_SITE_OTHER): Payer: BC Managed Care – PPO | Admitting: Family Medicine

## 2022-03-25 VITALS — BP 102/70 | Temp 97.7°F | Ht 65.0 in | Wt 134.0 lb

## 2022-03-25 DIAGNOSIS — R300 Dysuria: Secondary | ICD-10-CM

## 2022-03-25 LAB — POCT URINALYSIS DIPSTICK
Spec Grav, UA: 1.015 (ref 1.010–1.025)
pH, UA: 6 (ref 5.0–8.0)

## 2022-03-25 MED ORDER — CEPHALEXIN 500 MG PO CAPS: 500.0000 mg | ORAL_CAPSULE | Freq: Two times a day (BID) | ORAL | 0 refills | Status: AC

## 2022-03-25 NOTE — Progress Notes (Signed)
Subjective:  Patient ID: Amber Richard, female    DOB: [birth year]  Age: 37 y.o. MRN: [id number]  CC: Chief Complaint  Patient presents with   Dysuria    HPI:  37 year old female presents for evaluation of the above.  Symptoms started this am. Reports urinary frequency and dysuria. Also having low back pain. No fever. No flank pain. Has taken a single dose of leftover Macrobid as well as Azo without resolution.  No other complaints or concerns at this time.  Patient Active Problem List   Diagnosis Date Noted   Dysuria 03/25/2022   External hemorrhoid 07/03/2021   Previous cesarean section 11/19/2020   History of recurrent UTIs 12/08/2019   Depression, recurrent ([place]) 04/09/2017   Generalized anxiety disorder 04/09/2017    Social Hx   Social History   Socioeconomic History   Marital status: Married    Spouse name: Not on file   Number of children: Not on file   Years of education: Not on file   Highest education level: Not on file  Occupational History   Not on file  Tobacco Use   Smoking status: Never   Smokeless tobacco: Never  Vaping Use   Vaping Use: Never used  Substance and Sexual Activity   Alcohol use: No   Drug use: No   Sexual activity: Yes    Birth control/protection: None  Other Topics Concern   Not on file  Social History Narrative   Not on file   Social Determinants of Health   Financial Resource Strain: Not on file  Food Insecurity: Not on file  Transportation Needs: Not on file  Physical Activity: Not on file  Stress: Not on file  Social Connections: Not on file    Review of Systems Per HPI  Objective:  BP 102/70   Temp 97.7 F (36.5 C) (Oral)   Ht 5\' 5"  (1.651 m)   Wt 134 lb (60.8 kg)   BMI 22.30 kg/m      03/25/2022    3:43 PM 01/02/2022    2:31 PM 12/23/2021    4:17 PM  BP/Weight  Systolic BP [phone]  Diastolic BP 70 67 67  Wt. (Lbs) 134 134 131.8  BMI 22.3 kg/m2 22.3 kg/m2 21.93 kg/m2    Physical  Exam Vitals and nursing note reviewed.  Constitutional:      General: She is not in acute distress.    Appearance: Normal appearance.  HENT:     Head: Normocephalic and atraumatic.  Cardiovascular:     Rate and Rhythm: Normal rate and regular rhythm.  Pulmonary:     Effort: Pulmonary effort is normal.     Breath sounds: Normal breath sounds. No wheezing, rhonchi or rales.  Abdominal:     General: There is no distension.     Palpations: Abdomen is soft.     Tenderness: There is no abdominal tenderness.  Neurological:     Mental Status: She is alert.  Psychiatric:        Mood and Affect: Mood normal.        Behavior: Behavior normal.     Lab Results  Component Value Date   WBC 4.5 04/15/2021   HGB 12.5 04/15/2021   HCT 37.1 04/15/2021   PLT 153 04/15/2021   GLUCOSE 82 04/15/2021   ALT 23 04/15/2021   AST 28 04/15/2021   NA 144 04/15/2021   K 3.9 04/15/2021   CL 103 04/15/2021   CREATININE 0.72 04/15/2021  BUN 11 04/15/2021   CO2 26 04/15/2021   INR 1.0 08/01/2019     Assessment & Plan:   Problem List Items Addressed This Visit       Other   Dysuria - Primary    UA consistent with UTI. Sending culture. Starting on Keflex.      Relevant Orders   POCT urinalysis dipstick (Completed)   Urine Culture    Meds ordered this encounter  Medications   cephALEXin (KEFLEX) 500 MG capsule    Sig: Take 1 capsule (500 mg total) by mouth 2 (two) times daily.    Dispense:  14 capsule    Refill:  0    Follow-up:  Return if symptoms worsen or fail to improve.  [facility]

## 2022-03-25 NOTE — Assessment & Plan Note (Signed)
UA consistent with UTI. Sending culture. Starting on Keflex.  

## 2022-03-27 LAB — SPECIMEN STATUS REPORT

## 2022-03-27 LAB — URINE CULTURE

## 2022-04-14 ENCOUNTER — Other Ambulatory Visit: Payer: Self-pay | Admitting: Family Medicine

## 2023-10-02 ENCOUNTER — Telehealth: Payer: Self-pay | Admitting: Family Medicine

## 2023-10-02 NOTE — Telephone Encounter (Signed)
 Copied from CRM #[id number]. Topic: Clinical - Prescription Issue >> Oct 02, 2023  4:09 PM Delon DASEN wrote: Reason for CRM: citalopram  (CELEXA )- wrong medication was requested, it should have been citalopram  (CELEXA )- [phone]- she does not need antibiotics

## 2023-10-02 NOTE — Telephone Encounter (Unsigned)
 Copied from CRM [id number]. Topic: Clinical - Medication Refill >> Oct 02, 2023  1:36 PM Harlene ORN wrote: Medication: cephALEXin  (KEFLEX ) 500 MG capsule  Has the patient contacted their pharmacy? Yes (Agent: If no, request that the patient contact the pharmacy for the refill. If patient does not wish to contact the pharmacy document the reason why and proceed with request.) (Agent: If yes, when and what did the pharmacy advise?)  This is the patient's preferred pharmacy:    [facility] [address] [place], [place] [address] Phone: [phone]  Is this the correct pharmacy for this prescription? Yes If no, delete pharmacy and type the correct one.   Has the prescription been filled recently? Yes  Is the patient out of the medication? Yes  Has the patient been seen for an appointment in the last year OR does the patient have an upcoming appointment? Yes  Can we respond through MyChart? No  Please note: [place]. Her Prescription was thrown away by accident.  Agent: Please be advised that Rx refills may take up to 3 business days. We ask that you follow-up with your pharmacy.
